# Patient Record
Sex: Female | Born: 1960 | Race: White | Hispanic: No | Marital: Married | Smoking: Current every day smoker
Health system: Southern US, Community
[De-identification: ages and names within clinical notes are randomized; demographics above are authoritative.]

## PROBLEM LIST (undated history)

## (undated) DIAGNOSIS — M5416 Radiculopathy, lumbar region: Secondary | ICD-10-CM

## (undated) DIAGNOSIS — R918 Other nonspecific abnormal finding of lung field: Secondary | ICD-10-CM

## (undated) DIAGNOSIS — M5430 Sciatica, unspecified side: Secondary | ICD-10-CM

## (undated) DIAGNOSIS — G8929 Other chronic pain: Secondary | ICD-10-CM

## (undated) DIAGNOSIS — M549 Dorsalgia, unspecified: Secondary | ICD-10-CM

## (undated) HISTORY — PX: PLEURAL SCARIFICATION: SHX748

---

## 2010-06-10 ENCOUNTER — Emergency Department (HOSPITAL_COMMUNITY)
Admission: EM | Admit: 2010-06-10 | Discharge: 2010-06-10 | Payer: Self-pay | Source: Home / Self Care | Admitting: Emergency Medicine

## 2012-01-13 ENCOUNTER — Emergency Department (HOSPITAL_COMMUNITY)

## 2012-01-13 ENCOUNTER — Encounter (HOSPITAL_COMMUNITY): Payer: Self-pay | Admitting: *Deleted

## 2012-01-13 ENCOUNTER — Emergency Department (HOSPITAL_COMMUNITY)
Admission: EM | Admit: 2012-01-13 | Discharge: 2012-01-13 | Disposition: A | Discharge status: Home / Self Care | Attending: Emergency Medicine | Admitting: Emergency Medicine

## 2012-01-13 DIAGNOSIS — M546 Pain in thoracic spine: Secondary | ICD-10-CM | POA: Insufficient documentation

## 2012-01-13 DIAGNOSIS — F17200 Nicotine dependence, unspecified, uncomplicated: Secondary | ICD-10-CM | POA: Insufficient documentation

## 2012-01-13 DIAGNOSIS — R0789 Other chest pain: Secondary | ICD-10-CM

## 2012-01-13 LAB — BASIC METABOLIC PANEL
BUN: 16 mg/dL (ref 6–23)
CO2: 27 mEq/L (ref 19–32)
Calcium: 9.4 mg/dL (ref 8.4–10.5)
GFR calc non Af Amer: 78 mL/min — ABNORMAL LOW (ref >90)
Glucose, Bld: 95 mg/dL (ref 70–99)
Sodium: 135 mEq/L (ref 135–145)

## 2012-01-13 LAB — CBC WITH DIFFERENTIAL/PLATELET
Eosinophils Absolute: 0.3 10*3/uL (ref 0.0–0.7)
Eosinophils Relative: 3 % (ref 0–5)
HCT: 41.4 % (ref 36.0–46.0)
Hemoglobin: 14.1 g/dL (ref 12.0–15.0)
Lymphocytes Relative: 21 % (ref 12–46)
Lymphs Abs: 1.8 10*3/uL (ref 0.7–4.0)
MCH: 31.9 pg (ref 26.0–34.0)
MCV: 93.7 fL (ref 78.0–100.0)
Monocytes Relative: 10 % (ref 3–12)
Platelets: 209 10*3/uL (ref 150–400)
RBC: 4.42 MIL/uL (ref 3.87–5.11)
WBC: 8.8 10*3/uL (ref 4.0–10.5)

## 2012-01-13 NOTE — ED Provider Notes (Signed)
History   This chart was scribed for Kristina Lyons, MD by Melba Coon. The patient was seen in room APA18/APA18 and the patient's care was started at 3:31PM.    CSN: [id number]  Arrival date & time 01/13/12  1447   First MD Initiated Contact with Patient 01/13/12 1529      Chief Complaint  Patient presents with  . Shoulder Pain    (Consider location/radiation/quality/duration/timing/severity/associated sxs/prior treatment) The history is provided by the patient. No language interpreter was used.   Kristina Edwards is a 51 y.o. female who presents to the Emergency Department complaining of constant, moderate to severe, sharp, left shoulder pain that radiates to the left axilla with tingling down the left arm with an onset 2 days ago. 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 SOB present from pain; deep inhalation aggravates the pain; pt tries to breathe from her mouth. Pt has HX of collapsed lung "years and years ago" that had no trauma; pt states that she had similar pain then. No HA, fever, neck pain, sore throat, rash, back pain, CP, SOB, abd pain, n/v/d, dysuria, or extremity edema, weakness, or numbness. No other pertinent medical symptoms. Pt is a current smoker (1 pack a day).   History reviewed. No pertinent past medical history.  History reviewed. No pertinent past surgical history.  History reviewed. No pertinent family history.  History  Substance Use Topics  . Smoking status: Current Everyday Smoker -- 1.0 packs/day    Types: Cigarettes  . Smokeless tobacco: Not on file  . Alcohol Use: Yes    OB History    Grav Para Term Preterm Abortions TAB SAB Ect Mult Living                  Review of Systems 10 Systems reviewed and all are negative for acute change except as noted in the HPI.   Allergies  Clindamycin/lincomycin; Codeine; and Penicillins  Home Medications  No current outpatient  prescriptions on file.  BP 151/80  Pulse 77  Temp 98 F (36.7 C) (Oral)  Resp 18  Ht 5\' 4"  (1.626 m)  Wt 112 lb (50.803 kg)  BMI 19.22 kg/m2  SpO2 100%  LMP 12/04/2011  Physical Exam  Nursing note and vitals reviewed. Constitutional: She is oriented to person, place, and time. She appears well-developed and well-nourished.  HENT:  Head: Normocephalic and atraumatic.  Eyes: EOM are normal. Pupils are equal, round, and reactive to light.  Neck: Normal range of motion. Neck supple.  Cardiovascular: Normal rate, normal heart sounds and intact distal pulses.   Pulmonary/Chest: Effort normal and breath sounds normal. She exhibits tenderness (left chest wall).  Abdominal: Bowel sounds are normal. She exhibits no distension. There is no tenderness.  Musculoskeletal: Normal range of motion. She exhibits tenderness (ttp soft tissue in left scapular region). She exhibits no edema.  Neurological: She is alert and oriented to person, place, and time. She has normal strength. No cranial nerve deficit or sensory deficit.       NV and strength intact in bilateral upper extremities.  Skin: Skin is warm and dry. No rash noted.  Psychiatric: She has a normal mood and affect.    ED Course  Procedures (including critical care time)  DIAGNOSTIC STUDIES: Oxygen Saturation is 100% on room air, normal by my interpretation.    COORDINATION OF CARE:  3:36PM - CXR, blood w/u,  and EKG will be ordered for the pt. 5:26PM - recheck; imaging and lab results reviewed; results are unremarkable. Pt does not want Rx pain meds. Pt is advised to take ibuprofen. Pt ready for d/c.  Labs Reviewed  BASIC METABOLIC PANEL - Abnormal; Notable for the following:    GFR calc non Af Amer 78 (*)     GFR calc Af Amer 90 (*)     All other components within normal limits  CBC WITH DIFFERENTIAL  D-DIMER, QUANTITATIVE   Dg Chest 2 View  01/13/2012  *RADIOLOGY REPORT*  Clinical Data: 2 days of left-sided chest pain.   CHEST - 2 VIEW  Comparison: None.  Findings: Midline trachea.  Normal heart size and mediastinal contours. No pleural effusion or pneumothorax.  Biapical pleural parenchymal scarring. Diffuse peribronchial thickening.  Clear lungs.  IMPRESSION: 1.  No acute cardiopulmonary disease. 2.  Mild peribronchial thickening which may relate to chronic bronchitis or smoking.  Original Report Authenticated By: Consuello Bossier, M.D.     No diagnosis found.   Date: 01/13/2012  Rate: 74  Rhythm: normal sinus rhythm  QRS Axis: normal  Intervals: normal  ST/T Wave abnormalities: normal  Conduction Disutrbances:none  Narrative Interpretation:   Old EKG Reviewed: unchanged    MDM  The patient presents complaining of pain in the left upper back, lateral chest area.  There is nothing in the workup to suggest a cardiac etiology and the D-Dimer is negative, making pe very unlikely.  She is also not hypoxic, tachycardic or tachypneic.  The chest xray does not reveal a ptx or pneumonia.  The symptoms are likely musculoskeletal in nature.  She will be discharged to home with nsaids, rest, time.     I personally performed the services described in this documentation, which was scribed in my presence. The recorded information has been reviewed and considered.           Kristina Lyons, MD 01/13/12 [phone]

## 2012-01-13 NOTE — ED Notes (Signed)
Pt c/o pain under her left shoulder down her arm x 2 days. Pt c/o worsening pain with deep breathing. Denies injury.

## 2013-03-14 ENCOUNTER — Encounter (HOSPITAL_COMMUNITY): Payer: Self-pay | Admitting: Emergency Medicine

## 2013-03-14 ENCOUNTER — Emergency Department (HOSPITAL_COMMUNITY): Payer: TRICARE For Life (TFL)

## 2013-03-14 ENCOUNTER — Observation Stay (HOSPITAL_COMMUNITY)
Admission: EM | Admit: 2013-03-14 | Discharge: 2013-03-15 | Disposition: A | Discharge status: Home / Self Care | Payer: TRICARE For Life (TFL) | Attending: Family Medicine | Admitting: Family Medicine

## 2013-03-14 DIAGNOSIS — R911 Solitary pulmonary nodule: Secondary | ICD-10-CM

## 2013-03-14 DIAGNOSIS — F17200 Nicotine dependence, unspecified, uncomplicated: Secondary | ICD-10-CM | POA: Insufficient documentation

## 2013-03-14 DIAGNOSIS — R918 Other nonspecific abnormal finding of lung field: Secondary | ICD-10-CM | POA: Diagnosis present

## 2013-03-14 DIAGNOSIS — N390 Urinary tract infection, site not specified: Secondary | ICD-10-CM | POA: Diagnosis present

## 2013-03-14 DIAGNOSIS — D72829 Elevated white blood cell count, unspecified: Secondary | ICD-10-CM | POA: Diagnosis present

## 2013-03-14 DIAGNOSIS — N3090 Cystitis, unspecified without hematuria: Principal | ICD-10-CM | POA: Insufficient documentation

## 2013-03-14 DIAGNOSIS — R109 Unspecified abdominal pain: Secondary | ICD-10-CM | POA: Diagnosis present

## 2013-03-14 HISTORY — DX: Other nonspecific abnormal finding of lung field: R91.8

## 2013-03-14 LAB — URINALYSIS, ROUTINE W REFLEX MICROSCOPIC
Glucose, UA: NEGATIVE mg/dL
Ketones, ur: NEGATIVE mg/dL
pH: 6 (ref 5.0–8.0)

## 2013-03-14 LAB — CBC WITH DIFFERENTIAL/PLATELET
Eosinophils Relative: 2 % (ref 0–5)
HCT: 41.1 % (ref 36.0–46.0)
Hemoglobin: 13.8 g/dL (ref 12.0–15.0)
Lymphocytes Relative: 13 % (ref 12–46)
Lymphs Abs: 1.6 10*3/uL (ref 0.7–4.0)
MCV: 93.2 fL (ref 78.0–100.0)
Monocytes Absolute: 1 10*3/uL (ref 0.1–1.0)
Monocytes Relative: 9 % (ref 3–12)
Neutro Abs: 9.2 10*3/uL — ABNORMAL HIGH (ref 1.7–7.7)
WBC: 12 10*3/uL — ABNORMAL HIGH (ref 4.0–10.5)

## 2013-03-14 LAB — POCT PREGNANCY, URINE: Preg Test, Ur: NEGATIVE

## 2013-03-14 LAB — PROTIME-INR
INR: 1.04 (ref 0.00–1.49)
Prothrombin Time: 13.4 seconds (ref 11.6–15.2)

## 2013-03-14 LAB — BASIC METABOLIC PANEL
CO2: 27 mEq/L (ref 19–32)
Chloride: 100 mEq/L (ref 96–112)
GFR calc Af Amer: >90 mL/min (ref >90)
Potassium: 3.8 mEq/L (ref 3.5–5.1)

## 2013-03-14 LAB — AMYLASE: Amylase: 66 U/L (ref 0–105)

## 2013-03-14 LAB — URINE MICROSCOPIC-ADD ON

## 2013-03-14 LAB — LIPASE, BLOOD: Lipase: 27 U/L (ref 11–59)

## 2013-03-14 MED ORDER — IOHEXOL 300 MG/ML  SOLN
100.0000 mL | Freq: Once | INTRAMUSCULAR | Status: AC | PRN
  Administered 2013-03-14: 100 mL via INTRAVENOUS

## 2013-03-14 MED ORDER — CIPROFLOXACIN IN D5W 400 MG/200ML IV SOLN
400.0000 mg | Freq: Two times a day (BID) | INTRAVENOUS | Status: DC
  Administered 2013-03-15: 400 mg via INTRAVENOUS
  Filled 2013-03-14 (×5): qty 200

## 2013-03-14 MED ORDER — CIPROFLOXACIN IN D5W 200 MG/100ML IV SOLN
200.0000 mg | Freq: Two times a day (BID) | INTRAVENOUS | Status: DC
  Filled 2013-03-14 (×6): qty 100

## 2013-03-14 MED ORDER — HYDROMORPHONE HCL PF 1 MG/ML IJ SOLN: 0.5000 mg | INTRAMUSCULAR | Status: DC | PRN

## 2013-03-14 MED ORDER — ONDANSETRON HCL 4 MG/2ML IJ SOLN
4.0000 mg | Freq: Once | INTRAMUSCULAR | Status: AC
  Administered 2013-03-14: 4 mg via INTRAVENOUS
  Filled 2013-03-14: qty 2

## 2013-03-14 MED ORDER — ACETAMINOPHEN 325 MG PO TABS
650.0000 mg | ORAL_TABLET | Freq: Four times a day (QID) | ORAL | Status: DC | PRN
  Administered 2013-03-14: 650 mg via ORAL
  Filled 2013-03-14: qty 2

## 2013-03-14 MED ORDER — ONDANSETRON HCL 4 MG/2ML IJ SOLN: 4.0000 mg | Freq: Four times a day (QID) | INTRAMUSCULAR | Status: DC | PRN

## 2013-03-14 MED ORDER — IOHEXOL 300 MG/ML  SOLN
50.0000 mL | Freq: Once | INTRAMUSCULAR | Status: AC | PRN
  Administered 2013-03-14: 50 mL via ORAL

## 2013-03-14 MED ORDER — SODIUM CHLORIDE 0.9 % IV BOLUS (SEPSIS)
500.0000 mL | Freq: Once | INTRAVENOUS | Status: AC
  Administered 2013-03-14: 500 mL via INTRAVENOUS

## 2013-03-14 MED ORDER — ACETAMINOPHEN 650 MG RE SUPP: 650.0000 mg | Freq: Four times a day (QID) | RECTAL | Status: DC | PRN

## 2013-03-14 MED ORDER — ONDANSETRON HCL 4 MG PO TABS: 4.0000 mg | ORAL_TABLET | Freq: Four times a day (QID) | ORAL | Status: DC | PRN

## 2013-03-14 MED ORDER — SODIUM CHLORIDE 0.9 % IV SOLN
INTRAVENOUS | Status: DC
  Administered 2013-03-15: via INTRAVENOUS

## 2013-03-14 MED ORDER — ENOXAPARIN SODIUM 40 MG/0.4ML ~~LOC~~ SOLN: 40.0000 mg | SUBCUTANEOUS | Status: DC

## 2013-03-14 MED ORDER — HYDROMORPHONE HCL PF 1 MG/ML IJ SOLN
1.0000 mg | Freq: Once | INTRAMUSCULAR | Status: AC
  Administered 2013-03-14: 1 mg via INTRAVENOUS
  Filled 2013-03-14: qty 1

## 2013-03-14 MED ORDER — CIPROFLOXACIN IN D5W 400 MG/200ML IV SOLN
400.0000 mg | Freq: Once | INTRAVENOUS | Status: AC
  Administered 2013-03-14: 400 mg via INTRAVENOUS
  Filled 2013-03-14: qty 200

## 2013-03-14 NOTE — H&P (Signed)
Patient seen, independently examined and chart reviewed. I agree with exam, assessment and plan discussed with Toya Smothers, NP.  52 year old woman who presented to the emergency department with sudden onset of severe lower abdominal pain. Symptoms began on 10/16 with dysuria, she took over the counter medication without significant improvement but had little pain. Today 10/17 in the morning she developed severe lower abdominal pain, suprapubic, left lower quadrant, right lower quadrant. She has some soreness in her lower back bilaterally paravertebrally as well. She has had no fever, nausea or vomiting. She has no history of nephrolithiasis. She has had no abdominal surgeries. She has had a few bladder infections in the past but never with severe pain.  In the emergency department she was noted to be afebrile with stable vital signs. White blood cell count 12, CBC otherwise unremarkable and basic metabolic panel is normal. Urine pregnancy was negative. Urinalysis was notable for too numerous to count red blood cells, 7-10 white blood cells, large leukocyte esterase and nitrate positive. Because of exquisite lower abdominal pain suprapubic pain EDP performed a pelvic exam which was unremarkable. CT of the abdomen and pelvis with contrast was unremarkable although appendix could not be visualized.  Currently she feels somewhat better but still has lower abdominal pain. Heart and lung exam is unremarkable. She appears nontoxic. No upper abdominal pain. She has some bilateral CVA tenderness left greater than right. She has exquisite suprapubic pain, moderate to severe right lower quadrant and left lower quadrant pain. Positive rebound. No guarding.  A/P  Marked suprapubic and lower abdominal pain without fever, nausea or vomiting  Suspected hemorrhagic cystitis, possible developing pyelonephritis  Nonvisualization of the appendix on CT abdomen and pelvis  Incidental finding of lung nodules  Cigarette  smoker  Discussed with Dr. Lovell Sheehan, he will see in consultation, but for now allow a diet. Given history and objective findings, UTI the most likely explanation for pain. For now we will treat UTI empirically, followup urine culture,pain control, serial abdominal exam. If condition fails to improve or patient worsens then diagnostic laparoscopic would be considered. Differential would include appendicitis. Adnexa appeared normal on CT and patient without fever, n/v or bleeding. Pelvic exam unremarkable per EDP.  Above discussed with patient and husband at bedside. I reviewed all the laboratory and imaging findings with them.  Brendia Sacks, MD [facility] [phone]

## 2013-03-14 NOTE — ED Provider Notes (Signed)
CSN: [id number]     Arrival date & time 03/14/13  1231 History   First MD Initiated Contact with Patient 03/14/13 1259     Chief Complaint  Patient presents with  . Dysuria    Patient is a 52 y.o. female presenting with abdominal pain. The history is provided by the patient and a significant other.  Abdominal Pain Pain location:  Suprapubic Pain quality: aching   Pain severity:  Moderate Onset quality:  Gradual Duration:  1 day Timing:  Constant Progression:  Worsening Relieved by:  Nothing Worsened by:  Palpation Associated symptoms: dysuria   Associated symptoms: no chest pain, no fever, no vaginal bleeding and no vaginal discharge     PMH - none  Past Surgical History  Procedure Laterality Date  . Cesarean section    . Pleural scarification     History reviewed. No pertinent family history. History  Substance Use Topics  . Smoking status: Current Every Day Smoker -- 1.00 packs/day    Types: Cigarettes  . Smokeless tobacco: Not on file  . Alcohol Use: Yes   OB History   Grav Para Term Preterm Abortions TAB SAB Ect Mult Living                 Review of Systems  Constitutional: Negative for fever.  Cardiovascular: Negative for chest pain.  Gastrointestinal: Positive for abdominal pain.  Genitourinary: Positive for dysuria and frequency. Negative for vaginal bleeding and vaginal discharge.  Musculoskeletal: Positive for back pain.  All other systems reviewed and are negative.    Allergies  Clindamycin/lincomycin; Codeine; and Penicillins  Home Medications   Current Outpatient Rx  Name  Route  Sig  Dispense  Refill  . Phenazopyridine HCl (AZO DINE MAXIMUM STRENGTH) 97.5 MG TABS   Oral   Take 2 tablets by mouth 3 (three) times daily after meals.          BP 147/84  Pulse 84  Temp(Src) 97.7 F (36.5 C) (Oral)  Resp 20  Ht 5\' 4"  (1.626 m)  Wt 110 lb (49.896 kg)  BMI 18.87 kg/m2  SpO2 100%  LMP 02/12/2013 Physical Exam CONSTITUTIONAL: Well  developed/well nourished, anxious HEAD: Normocephalic/atraumatic EYES: EOMI/PERRL ENMT: Mucous membranes moist NECK: supple no meningeal signs SPINE:entire spine nontender CV: S1/S2 noted, no murmurs/rubs/gallops noted LUNGS: Lungs are clear to auscultation bilaterally, no apparent distress ABDOMEN: soft, diffusely tender, mostly tender in RLQ/LLQ and suprapubic region.  There is moderate tenderness present GU:no cva tenderness, no CMT, no vag bleeding noted.  No adnexal mass noted.  Female chaperone present NEURO: Pt is awake/alert, moves all extremitiesx4 EXTREMITIES: pulses normal, full ROM SKIN: warm, color normal PSYCH: anxious  ED Course  Procedures Labs Review Labs Reviewed  URINALYSIS, ROUTINE W REFLEX MICROSCOPIC - Abnormal; Notable for the following:    Hgb urine dipstick LARGE (*)    Protein, ur 30 (*)    Nitrite POSITIVE (*)    Leukocytes, UA LARGE (*)    All other components within normal limits  URINE MICROSCOPIC-ADD ON - Abnormal; Notable for the following:    Bacteria, UA MANY (*)    All other components within normal limits  URINE CULTURE  BASIC METABOLIC PANEL  CBC WITH DIFFERENTIAL  POCT PREGNANCY, URINE   Imaging Review No results found.  EKG Interpretation   None      1:48 PM Pt presenting with what she thought was UTI.  However, she has significant lower abdominal tenderness and pain out of proportion to  exam.  Will obtain CT imaging Pt agreeable with plan 3:18 PM I discussed with patient about CT findings - she is aware that she needs repeat CT scan of chest in 6-12 months for lung nodules (pt is a smoker) Appendix not visualized.  She is still diffusely tender, with RLQ tenderness noted. She does not have any rigidity and her abdomen is soft to palpation D/w dr Lovell Sheehan.  Given continued pain and UTI, recommends hospitalist admission and surgical consult as would not be candidate for surgical exploration at this time 3:37 PM D/w dr Irene Limbo,  will admit to medicine for observation   MDM  No diagnosis found. Nursing notes including past medical history and social history reviewed and considered in documentation Labs/vital reviewed and considered     Joya Gaskins, MD 03/14/13 1538

## 2013-03-14 NOTE — H&P (Signed)
[facility] History and Physical  Kristina Edwards:[id number] DOB: 04-21-1961 DOA: 03/14/2013  Referring physician:  PCP: Kirstie Peri, MD  Specialists:   Chief Complaint: Dysuria and lower abdominal pain  HPI: Kristina Edwards is a 52 y.o. female with basically no medical history who presents to the emergency department with chief complaint of lower abdominal pain. Information obtained from the patient and her husband is at the bedside. She reports that yesterday morning she developed dysuria with urination particularly at the initiation of flow. She states that she got some over-the-counter medication but the dysuria did not subside. She also reports frequency. She denies hematuria Then this morning when she awakened she developed sudden right lower quadrant pain. She denies any nausea or vomiting. She denies any constipation or diarrhea. She does indicate that she had a normal bowel movement yesterday. She denies any fever chills chest pain palpitations headache dizziness syncope or near-syncope. She denies any vaginal discharge She indicates that she went to work and the pain became so intense that she could barely stand up. She came to the emergency room. Initial evaluation in the emergency room yields urinary tract infection via urinalysis, lab work significant for a white count of 12.0 otherwise unremarkable, CT of the abdomen no acute findings in the abdomen or pelvis but the appendix was not visualized. On exam abdominal tenderness and pain is out of proportion. Vital signs were unremarkable. We are asked to admit for further evaluation    Review of Systems: 10 point review of systems completed all systems are negative except as indicated in the history of present illness History reviewed. No pertinent past medical history. Past Surgical History  Procedure Laterality Date  . Cesarean section    . Pleural scarification     Social History:  reports that she has been smoking  Cigarettes.  She has been smoking about 1.00 pack per day. She does not have any smokeless tobacco history on file. She reports that she drinks alcohol. She reports that she does not use illicit drugs. Patient lives at home with her husband. 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 Clindamycin/Lincomycin Other (See Comments)    Chest pain  . Codeine Nausea And Vomiting  . Penicillins Rash    History reviewed. No pertinent family history. mother's medical history includes hypertension hyperlipidemia. Father's medical history includes hypertension  Prior to Admission medications   Medication Sig Start Date End Date Taking? Authorizing Provider  Phenazopyridine HCl (AZO DINE MAXIMUM STRENGTH) 97.5 MG TABS Take 2 tablets by mouth 3 (three) times daily after meals.   Yes Historical Provider, MD   Physical Exam: Filed Vitals:   03/14/13 1507  BP: 124/74  Pulse: 81  Temp:   Resp: 18     General:  Well-nourished somewhat uncomfortable appearing no acute distress  Eyes: PERRLA, EOMI, no scleral icterus  ENT: Ears clear nose without drainage oropharynx without erythema or exudate. Mucous membranes of the mouth are somewhat dry but pink  Neck: Supple no JVD full range of motion no lymphadenopathy  Cardiovascular: Regular rate and rhythm no murmur no gallop no rub no lower extremity edema  Respiratory: Normal effort breath sounds slightly coarse but clear bilaterally I hear no wheeze no rhonchi  Abdomen: Soft diffusely tender to even the placement of the stethoscope when auscultating bowel sounds. Tenderness and pain focused on lower quadrants and suprapubic region. No guarding questionable rebound in right lower. Costovertebral angle with  mild tenderness  Skin: Warm and dry no rashes  Musculoskeletal: No clubbing no cyanosis good muscle tone  Psychiatric: Calm cooperative somewhat teary  Neurologic: Cranial nerves II through XII  grossly intact speech clear facial symmetry  Labs on Admission:  Basic Metabolic Panel:  Recent Labs Lab 03/14/13 1322  NA 136  K 3.8  CL 100  CO2 27  GLUCOSE 94  BUN 12  CREATININE 0.86  CALCIUM 9.6   Liver Function Tests: No results found for this basename: AST, ALT, ALKPHOS, BILITOT, PROT, ALBUMIN,  in the last 168 hours No results found for this basename: LIPASE, AMYLASE,  in the last 168 hours No results found for this basename: AMMONIA,  in the last 168 hours CBC:  Recent Labs Lab 03/14/13 1322  WBC 12.0*  NEUTROABS 9.2*  HGB 13.8  HCT 41.1  MCV 93.2  PLT 220   Cardiac Enzymes: No results found for this basename: CKTOTAL, CKMB, CKMBINDEX, TROPONINI,  in the last 168 hours  BNP (last 3 results) No results found for this basename: PROBNP,  in the last 8760 hours CBG: No results found for this basename: GLUCAP,  in the last 168 hours  Radiological Exams on Admission: Ct Abdomen Pelvis W Contrast  03/14/2013   CLINICAL DATA:  Pelvic pain off and on.  EXAM: CT ABDOMEN AND PELVIS WITH CONTRAST  TECHNIQUE: Multidetector CT imaging of the abdomen and pelvis was performed using the standard protocol following bolus administration of intravenous contrast.  CONTRAST:  OMNIPAQUE IOHEXOL 300 MG/ML  SOLN  COMPARISON:  None.  FINDINGS: Minimal dependent subsegmental atelectasis in the lung bases. 2 small adjacent pleural based nodules in the left lower lobe on image 3 measuring approximately 6 mm. No pleural effusions. Heart is normal size.  Liver, gallbladder, spleen, pancreas, adrenals and kidneys are unremarkable. Stomach, large and small bowel are unremarkable. Appendix is not visualized, but no secondary signs of appendicitis. Uterus, adnexae and urinary bladder are unremarkable. No free fluid, free air or adenopathy. Aorta is normal caliber.  No acute bony abnormality.  IMPRESSION: No acute findings in the abdomen or pelvis.  2 pleural-based nodules in the left lower  lobe of approximately 6 mm. If the patient is at high risk for bronchogenic carcinoma, follow-up chest CT at 6-12 months is recommended. If the patient is at low risk for bronchogenic carcinoma, follow-up chest CT at 12 months is recommended. This recommendation follows the consensus statement: Guidelines for Management of Small Pulmonary Nodules Detected on CT Scans: A Statement from the Fleischner Society as published in Radiology 2005;237:395-400.   Electronically Signed   By: Charlett Nose M.D.   On: 03/14/2013 14:41    EKG:   Assessment/Plan Principal Problem:   Abdominal pain: Etiology uncertain. Presumably related to the urinary tract infection however pain and tenderness on exam is out of proportion to infection and CT findings. Some concern that appendix was not visualized. Will request general surgery consult. Will check lipase and amylase for completeness. Pregnancy test negative.  Will admit to medical floor for pain management, supportive therapy and further evaluation. Will keep n.p.o. until evaluated by general surgeon. Active Problems:   UTI (lower urinary tract infection): Await urine cultures. Continue Cipro.   Leukocytosis: Mild. Likely related to above. Patient is afebrile and nontoxic. Will continue Cipro IV.  Lung nodules. Incidental finding on abdominal CT. Left lower lobe lung nodules. Patient is a smoker so there is concern for malignancy. Recommend repeat CT in 6 months.  Code Status: full Family Communication:  Disposition Plan: home when ready  Time spent: 60 minutes  Gwenyth Bender [facility] Pager [phone]  If 7PM-7AM, please contact night-coverage [email or web address] Password [email or web address] 03/14/2013, 4:10 PM

## 2013-03-14 NOTE — ED Notes (Signed)
Dysuria and low abd pain , no NVD  Frequency of urination, NO fever or chills.

## 2013-03-14 NOTE — ED Notes (Signed)
Attempted report. Receiving RN reported would call back.  

## 2013-03-15 DIAGNOSIS — R918 Other nonspecific abnormal finding of lung field: Secondary | ICD-10-CM

## 2013-03-15 LAB — CBC
MCH: 31 pg (ref 26.0–34.0)
MCHC: 33.2 g/dL (ref 30.0–36.0)
MCV: 93.4 fL (ref 78.0–100.0)
Platelets: 197 10*3/uL (ref 150–400)
RBC: 3.94 MIL/uL (ref 3.87–5.11)
RDW: 12.6 % (ref 11.5–15.5)
WBC: 7.4 10*3/uL (ref 4.0–10.5)

## 2013-03-15 LAB — BASIC METABOLIC PANEL
CO2: 27 mEq/L (ref 19–32)
Calcium: 8.9 mg/dL (ref 8.4–10.5)
Creatinine, Ser: 0.78 mg/dL (ref 0.50–1.10)
GFR calc Af Amer: >90 mL/min (ref >90)
GFR calc non Af Amer: >90 mL/min (ref >90)
Sodium: 137 mEq/L (ref 135–145)

## 2013-03-15 MED ORDER — CIPROFLOXACIN HCL 500 MG PO TABS: 500.0000 mg | ORAL_TABLET | Freq: Two times a day (BID) | ORAL | Status: DC

## 2013-03-15 MED ORDER — CIPROFLOXACIN HCL 250 MG PO TABS: 500.0000 mg | ORAL_TABLET | Freq: Two times a day (BID) | ORAL | Status: DC

## 2013-03-15 NOTE — Discharge Summary (Signed)
Physician Discharge Summary  Kristina Edwards WUJ:[id number] DOB: 03-Aug-1960 DOA: 03/14/2013  PCP: Kristina Peri, MD  Admit date: 03/14/2013 Discharge date: 03/15/2013  Recommendations for Outpatient Follow-up:  1. Hemorrhagic cystitis--urine culture pending. 2. Continue to encourage smoking cessation 3. Incidental finding of lung nodules. Recommend repeat CAT scan in 6 months. See CT imaging report below.   Discharge Diagnoses:  1. Hemorrhagic cystitis, possible pyelonephritis with associated suprapubic and lower abdominal pain 2. Cigarette smoker 3. Incidental finding of lung nodules  Discharge Condition: Improved Disposition: Home  Diet recommendation: Regular  Filed Weights   03/14/13 1236 03/14/13 1700  Weight: 49.896 kg (110 lb) 52.708 kg (116 lb 3.2 oz)    History of present illness:  52 year old woman who presented to the emergency department with sudden onset of severe lower abdominal pain. Symptoms began on 10/16 with dysuria, she took over the counter medication without significant improvement but had little pain. Today 10/17 in the morning she developed severe lower abdominal pain, suprapubic, left lower quadrant, right lower quadrant.    Hospital Course:  Kristina Edwards was admitted for further evaluation of marked lower abdominal and suprapubic pain and treatment for hemorrhagic cystitis. Because of her significant lower abdominal pain and nonvisualization of the appendix general surgery consultation was obtained. She was seen in consultation with general surgery but given her marked clinical improvement with IV antibiotics and treatment for cystitis, no further evaluation was recommended. Her hospitalization was uncomplicated and she is stable for discharge. See individual issues as below.  1. Hemorrhagic cystitis, possible pyelonephritis: Much improved today. No fever, nausea or vomiting. Marked suprapubic and lower abdominal pain resolving rapidly. No evidence to suggest  acute intra-abdominal process. Discussed with Dr. Granville Lewis will plan discharge home. 2. Cigarette smoker: Recommend cessation. 3. Incidental finding of lung nodules: Followup as an outpatient  Consultants:  General surgery Procedures:  None Antibiotics:  Ciprofloxacin 10/18 >> 10/24  Discharge Instructions  Discharge Orders   Future Orders Complete By Expires   Activity as tolerated - No restrictions  As directed    Diet general  As directed    Discharge instructions  As directed    Comments:     Be sure to finish antibiotics. Call your physician or seek immediate medical attention for fever, worsening pain or worsening of condition. Please stop smoking. The CAT scan of your abdomen and pelvis showed 2 small lung nodules. This information will be sent to primary care doctor. For now all that is recommended as followup in 6-12 months.       Medication List    STOP taking these medications       AZO DINE MAXIMUM STRENGTH 97.5 MG Tabs  Generic drug:  Phenazopyridine HCl      TAKE these medications       ciprofloxacin 500 MG tablet  Commonly known as:  CIPRO  Take 1 tablet (500 mg total) by mouth 2 (two) times daily. First dose this evening at 6pm.       Allergies  Allergen Reactions  . Clindamycin/Lincomycin Other (See Comments)    Chest pain  . Codeine Nausea And Vomiting  . Penicillins Rash    The results of significant diagnostics from this hospitalization (including imaging, microbiology, ancillary and laboratory) are listed below for reference.    Significant Diagnostic Studies: Ct Abdomen Pelvis W Contrast  03/14/2013   CLINICAL DATA:  Pelvic pain off and on.  EXAM: CT ABDOMEN AND PELVIS WITH CONTRAST  TECHNIQUE: Multidetector CT imaging of the abdomen  and pelvis was performed using the standard protocol following bolus administration of intravenous contrast.  CONTRAST:  OMNIPAQUE IOHEXOL 300 MG/ML  SOLN  COMPARISON:  None.  FINDINGS: Minimal dependent  subsegmental atelectasis in the lung bases. 2 small adjacent pleural based nodules in the left lower lobe on image 3 measuring approximately 6 mm. No pleural effusions. Heart is normal size.  Liver, gallbladder, spleen, pancreas, adrenals and kidneys are unremarkable. Stomach, large and small bowel are unremarkable. Appendix is not visualized, but no secondary signs of appendicitis. Uterus, adnexae and urinary bladder are unremarkable. No free fluid, free air or adenopathy. Aorta is normal caliber.  No acute bony abnormality.  IMPRESSION: No acute findings in the abdomen or pelvis.  2 pleural-based nodules in the left lower lobe of approximately 6 mm. If the patient is at high risk for bronchogenic carcinoma, follow-up chest CT at 6-12 months is recommended. If the patient is at low risk for bronchogenic carcinoma, follow-up chest CT at 12 months is recommended. This recommendation follows the consensus statement: Guidelines for Management of Small Pulmonary Nodules Detected on CT Scans: A Statement from the Fleischner Society as published in Radiology 2005;237:395-400.   Electronically Signed   By: Charlett Nose M.D.   On: 03/14/2013 14:41   Labs: Basic Metabolic Panel:  Recent Labs Lab 03/14/13 1322 03/15/13 0648  NA 136 137  K 3.8 3.6  CL 100 102  CO2 27 27  GLUCOSE 94 99  BUN 12 11  CREATININE 0.86 0.78  CALCIUM 9.6 8.9    CBC:  Recent Labs Lab 03/14/13 1322 03/15/13 0648  WBC 12.0* 7.4  NEUTROABS 9.2*  --   HGB 13.8 12.2  HCT 41.1 36.8  MCV 93.2 93.4  PLT 220 197    Principal Problem:   Abdominal pain Active Problems:   UTI (lower urinary tract infection)   Leukocytosis   Lung nodules   Time coordinating discharge: 20 minutes  Signed:  Brendia Sacks, MD [facility] 03/15/2013, 10:19 AM

## 2013-03-15 NOTE — Plan of Care (Signed)
Problem: Discharge Progression Outcomes Goal: Other Discharge Outcomes/Goals Outcome: Completed/Met Date Met:  03/15/13 Pt denies pain tolerates diet and is ambulatory

## 2013-03-15 NOTE — Progress Notes (Signed)
[facility] PROGRESS NOTE  Kristina Edwards WJX:[id number] DOB: 1960/08/05 DOA: 03/14/2013 PCP: Kirstie Peri, MD  Assessment/Plan: 1. Hemorrhagic cystitis, possible pyelonephritis: Much improved today. No fever, nausea or vomiting. Marked suprapubic and lower abdominal pain resolving rapidly. No evidence to suggest acute intra-abdominal process. Discussed with Dr. Granville Lewis will plan discharge home. 2. Cigarette smoker: Recommend cessation. 3. Incidental finding of lung nodules: Followup as an outpatient   Change to oral antibiotics  Home today  Pending studies:   Urine culture  Brendia Sacks, MD  [facility]  Pager [phone] If 7PM-7AM, please contact night-coverage at [email or web address], password [facility] 03/15/2013, 10:07 AM  LOS: 1 day   Summary: 52 year old woman who presented to the emergency department with sudden onset of severe lower abdominal pain. Symptoms began on 10/16 with dysuria, she took over the counter medication without significant improvement but had little pain. Today 10/17 in the morning she developed severe lower abdominal pain, suprapubic, left lower quadrant, right lower quadrant.   Consultants:  General surgery  Procedures:  None  Antibiotics:  Ciprofloxacin 10/18 >> 10/24  HPI/Subjective: Feels much better. Feels ready to go home. Minimal pain. Tolerating diet.  Objective: Filed Vitals:   03/14/13 1700 03/14/13 1811 03/14/13 2050 03/15/13 0642  BP:  117/68 103/56 114/74  Pulse:  82 73 79  Temp:  97.9 F (36.6 C) 98.3 F (36.8 C) 98.7 F (37.1 C)  TempSrc:  Oral Oral Oral  Resp:  20 20 20   Height: 5\' 4"  (1.626 m)     Weight: 52.708 kg (116 lb 3.2 oz)     SpO2:  96% 97% 96%    Intake/Output Summary (Last 24 hours) at 03/15/13 1007 Last data filed at 03/14/13 1900  Gross per 24 hour  Intake    360 ml  Output      0 ml  Net    360 ml     Filed Weights   03/14/13 1236 03/14/13 1700  Weight: 49.896 kg (110 lb) 52.708 kg  (116 lb 3.2 oz)    Exam:   Afebrile, vital signs stable  General: Appears calm and comfortable. Appears well.  Psychiatric: Grossly normal mood and affect. Speech fluent and appropriate.  Cardiovascular: Regular rate and rhythm. No murmur, rub, gallop.  Respiratory: Clear to auscultation bilaterally. No wheezes, rales, rhonchi. Normal respiratory effort.  Abdomen: Soft, nondistended, minimal suprapubic tenderness.  Data Reviewed:  Leukocytosis has resolved, CBC normal.  Basic metabolic panel within normal limits.  Scheduled Meds: . ciprofloxacin  400 mg Intravenous Q12H   Continuous Infusions: . sodium chloride 75 mL/hr at 03/15/13 0004    Principal Problem:   Abdominal pain Active Problems:   UTI (lower urinary tract infection)   Leukocytosis   Lung nodules

## 2013-03-15 NOTE — Consult Note (Signed)
Reason for Consult: Abdominal pain, question appendicitis Referring Physician: [facility]  Kristina Edwards is an 52 y.o. female.  HPI: Patient is a 52 year old white female who presents to the emergency room with a 24-hour history of worsening lower abdominal pain. She states that the day before, she had an episode where she started having a burning while urinating. This pain seemed to progress over the next 24 hours and she presented to the emergency room with lower abdominal pain. CT scan the abdomen was performed which did not see the appendix. Her pain was across the lower abdomen, but seemed to be more on the right side. Pelvic exam was negative. Urinalysis was remarkable for a urinary tract infection, possible cystitis. Due to the concern for possible appendicitis and the need to control her pain, she was admitted to the hospital for further evaluation treatment. She states she does not have a history of frequent urinary tract infections. Today, she states her pain has much improved and is just localized to the suprapubic region.  Past Medical History  Diagnosis Date  . Lung nodules     LLL incidental finding CT 10/14. repeat 6 months    Past Surgical History  Procedure Laterality Date  . Cesarean section    . Pleural scarification      History reviewed. No pertinent family history.  Social History:  reports that she has been smoking Cigarettes.  She has been smoking about 1.00 pack per day. She does not have any smokeless tobacco history on file. She reports that she drinks alcohol. She reports that she does not use illicit drugs.  Allergies:  Allergies  Allergen Reactions  . Clindamycin/Lincomycin Other (See Comments)    Chest pain  . Codeine Nausea And Vomiting  . Penicillins Rash    Medications: I have reviewed the patient's current medications.  Results for orders placed during the hospital encounter of 03/14/13 (from the past 48 hour(s))  URINALYSIS, ROUTINE W  REFLEX MICROSCOPIC     Status: Abnormal   Collection Time    03/14/13 12:53 PM      Result Value Range   Color, Urine YELLOW  YELLOW   APPearance CLEAR  CLEAR   Specific Gravity, Urine 1.020  1.005 - 1.030   pH 6.0  5.0 - 8.0   Glucose, UA NEGATIVE  NEGATIVE mg/dL   Hgb urine dipstick LARGE (*) NEGATIVE   Bilirubin Urine NEGATIVE  NEGATIVE   Ketones, ur NEGATIVE  NEGATIVE mg/dL   Protein, ur 30 (*) NEGATIVE mg/dL   Urobilinogen, UA 0.2  0.0 - 1.0 mg/dL   Nitrite POSITIVE (*) NEGATIVE   Leukocytes, UA LARGE (*) NEGATIVE  URINE MICROSCOPIC-ADD ON     Status: Abnormal   Collection Time    03/14/13 12:53 PM      Result Value Range   Squamous Epithelial / LPF RARE  RARE   WBC, UA 7-10  <3 WBC/hpf   RBC / HPF TOO NUMEROUS TO COUNT  <3 RBC/hpf   Bacteria, UA MANY (*) RARE  POCT PREGNANCY, URINE     Status: None   Collection Time    03/14/13 12:56 PM      Result Value Range   Preg Test, Ur NEGATIVE  NEGATIVE   Comment:            THE SENSITIVITY OF THIS     METHODOLOGY IS >24 mIU/mL  BASIC METABOLIC PANEL     Status: Abnormal   Collection Time    03/14/13  1:22 PM      Result Value Range   Sodium 136  135 - 145 mEq/L   Potassium 3.8  3.5 - 5.1 mEq/L   Chloride 100  96 - 112 mEq/L   CO2 27  19 - 32 mEq/L   Glucose, Bld 94  70 - 99 mg/dL   BUN 12  6 - 23 mg/dL   Creatinine, Ser 0.86  0.50 - 1.10 mg/dL   Calcium 9.6  8.4 - 57.8 mg/dL   GFR calc non Af Amer 78 (*) >90 mL/min   GFR calc Af Amer >90  >90 mL/min   Comment: (NOTE)     The eGFR has been calculated using the CKD EPI equation.     This calculation has not been validated in all clinical situations.     eGFR's persistently <90 mL/min signify possible Chronic Kidney     Disease.  CBC WITH DIFFERENTIAL     Status: Abnormal   Collection Time    03/14/13  1:22 PM      Result Value Range   WBC 12.0 (*) 4.0 - 10.5 K/uL   RBC 4.41  3.87 - 5.11 MIL/uL   Hemoglobin 13.8  12.0 - 15.0 g/dL   HCT 46.9  62.9 - 52.8 %   MCV  93.2  78.0 - 100.0 fL   MCH 31.3  26.0 - 34.0 pg   MCHC 33.6  30.0 - 36.0 g/dL   RDW 41.3  24.4 - 01.0 %   Platelets 220  150 - 400 K/uL   Neutrophils Relative % 77  43 - 77 %   Neutro Abs 9.2 (*) 1.7 - 7.7 K/uL   Lymphocytes Relative 13  12 - 46 %   Lymphs Abs 1.6  0.7 - 4.0 K/uL   Monocytes Relative 9  3 - 12 %   Monocytes Absolute 1.0  0.1 - 1.0 K/uL   Eosinophils Relative 2  0 - 5 %   Eosinophils Absolute 0.2  0.0 - 0.7 K/uL   Basophils Relative 0  0 - 1 %   Basophils Absolute 0.0  0.0 - 0.1 K/uL  LIPASE, BLOOD     Status: None   Collection Time    03/14/13  1:22 PM      Result Value Range   Lipase 27  11 - 59 U/L  AMYLASE     Status: None   Collection Time    03/14/13  1:22 PM      Result Value Range   Amylase 66  0 - 105 U/L  PROTIME-INR     Status: None   Collection Time    03/14/13  1:22 PM      Result Value Range   Prothrombin Time 13.4  11.6 - 15.2 seconds   INR 1.04  0.00 - 1.49  BASIC METABOLIC PANEL     Status: None   Collection Time    03/15/13  6:48 AM      Result Value Range   Sodium 137  135 - 145 mEq/L   Potassium 3.6  3.5 - 5.1 mEq/L   Chloride 102  96 - 112 mEq/L   CO2 27  19 - 32 mEq/L   Glucose, Bld 99  70 - 99 mg/dL   BUN 11  6 - 23 mg/dL   Creatinine, Ser 2.72  0.50 - 1.10 mg/dL   Calcium 8.9  8.4 - 53.6 mg/dL   GFR calc non Af Amer >90  >90 mL/min  GFR calc Af Amer >90  >90 mL/min   Comment: (NOTE)     The eGFR has been calculated using the CKD EPI equation.     This calculation has not been validated in all clinical situations.     eGFR's persistently <90 mL/min signify possible Chronic Kidney     Disease.  CBC     Status: None   Collection Time    03/15/13  6:48 AM      Result Value Range   WBC 7.4  4.0 - 10.5 K/uL   RBC 3.94  3.87 - 5.11 MIL/uL   Hemoglobin 12.2  12.0 - 15.0 g/dL   HCT 40.9  81.1 - 91.4 %   MCV 93.4  78.0 - 100.0 fL   MCH 31.0  26.0 - 34.0 pg   MCHC 33.2  30.0 - 36.0 g/dL   RDW 78.2  95.6 - 21.3 %   Platelets  197  150 - 400 K/uL    Ct Abdomen Pelvis W Contrast  03/14/2013   CLINICAL DATA:  Pelvic pain off and on.  EXAM: CT ABDOMEN AND PELVIS WITH CONTRAST  TECHNIQUE: Multidetector CT imaging of the abdomen and pelvis was performed using the standard protocol following bolus administration of intravenous contrast.  CONTRAST:  OMNIPAQUE IOHEXOL 300 MG/ML  SOLN  COMPARISON:  None.  FINDINGS: Minimal dependent subsegmental atelectasis in the lung bases. 2 small adjacent pleural based nodules in the left lower lobe on image 3 measuring approximately 6 mm. No pleural effusions. Heart is normal size.  Liver, gallbladder, spleen, pancreas, adrenals and kidneys are unremarkable. Stomach, large and small bowel are unremarkable. Appendix is not visualized, but no secondary signs of appendicitis. Uterus, adnexae and urinary bladder are unremarkable. No free fluid, free air or adenopathy. Aorta is normal caliber.  No acute bony abnormality.  IMPRESSION: No acute findings in the abdomen or pelvis.  2 pleural-based nodules in the left lower lobe of approximately 6 mm. If the patient is at high risk for bronchogenic carcinoma, follow-up chest CT at 6-12 months is recommended. If the patient is at low risk for bronchogenic carcinoma, follow-up chest CT at 12 months is recommended. This recommendation follows the consensus statement: Guidelines for Management of Small Pulmonary Nodules Detected on CT Scans: A Statement from the Fleischner Society as published in Radiology 2005;237:395-400.   Electronically Signed   By: Charlett Nose M.D.   On: 03/14/2013 14:41    ROS: See chart Blood pressure 114/74, pulse 79, temperature 98.7 F (37.1 C), temperature source Oral, resp. rate 20, height 5\' 4"  (1.626 m), weight 52.708 kg (116 lb 3.2 oz), last menstrual period 02/12/2013, SpO2 96.00%. Physical Exam: Pleasant white female in no acute distress. Abdomen: Soft, flat. Mild tenderness to palpation in the suprapubic region, though  no specific right lower quadrant tenderness or rigidity.  Assessment/Plan: Impression: UTI/cystitis, improving. Given normalization of leukocytosis and significant relief of pain, I doubt she has acute appendicitis. No need for acute surgical intervention. I discussed this with the patient and husband. Plan: We'll advance to regular diet. Patient desires discharge today. I did give them instructions to return should her pain worsen over the next few days.  Taylon Louison A 03/15/2013, 7:56 AM

## 2013-03-16 LAB — URINE CULTURE: Colony Count: >=100000

## 2013-03-18 ENCOUNTER — Telehealth: Payer: Self-pay | Admitting: Family Medicine

## 2013-03-18 NOTE — Telephone Encounter (Signed)
Urine culture results available: E. Coli, sensitive to Cipro.  Was discharged on Cipro.  Brendia Sacks, MD [facility] [phone]

## 2014-09-29 ENCOUNTER — Emergency Department (HOSPITAL_COMMUNITY)
Admission: EM | Admit: 2014-09-29 | Discharge: 2014-09-29 | Disposition: A | Discharge status: Home / Self Care | Payer: TRICARE For Life (TFL) | Attending: Emergency Medicine | Admitting: Emergency Medicine

## 2014-09-29 ENCOUNTER — Emergency Department (HOSPITAL_COMMUNITY): Payer: TRICARE For Life (TFL)

## 2014-09-29 ENCOUNTER — Encounter (HOSPITAL_COMMUNITY): Payer: Self-pay

## 2014-09-29 DIAGNOSIS — Z720 Tobacco use: Secondary | ICD-10-CM | POA: Diagnosis not present

## 2014-09-29 DIAGNOSIS — Z79899 Other long term (current) drug therapy: Secondary | ICD-10-CM | POA: Diagnosis not present

## 2014-09-29 DIAGNOSIS — M5431 Sciatica, right side: Secondary | ICD-10-CM

## 2014-09-29 DIAGNOSIS — Z791 Long term (current) use of non-steroidal anti-inflammatories (NSAID): Secondary | ICD-10-CM | POA: Diagnosis not present

## 2014-09-29 DIAGNOSIS — Z880 Allergy status to penicillin: Secondary | ICD-10-CM | POA: Diagnosis not present

## 2014-09-29 DIAGNOSIS — M545 Low back pain: Secondary | ICD-10-CM | POA: Diagnosis present

## 2014-09-29 MED ORDER — CYCLOBENZAPRINE HCL 10 MG PO TABS
10.0000 mg | ORAL_TABLET | Freq: Once | ORAL | Status: AC
  Administered 2014-09-29: 10 mg via ORAL
  Filled 2014-09-29: qty 1

## 2014-09-29 MED ORDER — NAPROXEN 500 MG PO TABS
500.0000 mg | ORAL_TABLET | Freq: Two times a day (BID) | ORAL | Status: DC
Start: 2014-09-29 — End: 2015-08-28

## 2014-09-29 MED ORDER — CYCLOBENZAPRINE HCL 10 MG PO TABS: 10.0000 mg | ORAL_TABLET | Freq: Two times a day (BID) | ORAL | Status: DC | PRN

## 2014-09-29 MED ORDER — KETOROLAC TROMETHAMINE 60 MG/2ML IM SOLN
30.0000 mg | Freq: Once | INTRAMUSCULAR | Status: AC
  Administered 2014-09-29: 30 mg via INTRAMUSCULAR
  Filled 2014-09-29: qty 2

## 2014-09-29 NOTE — ED Provider Notes (Signed)
CSN: [id number]     Arrival date & time 09/29/14  1112 History   First MD Initiated Contact with Patient 09/29/14 1148     Chief Complaint  Patient presents with  . Back Pain     (Consider location/radiation/quality/duration/timing/severity/associated sxs/prior Treatment) Patient is a 54 y.o. female presenting with back pain. The history is provided by the patient.  Back Pain Location:  Lumbar spine Quality:  Shooting and burning Radiates to:  R posterior upper leg Pain severity:  Severe Onset quality:  Gradual Timing:  Constant Progression:  Worsening Chronicity:  New Relieved by:  Nothing Worsened by:  Ambulation and movement Ineffective treatments: excedrin. Associated symptoms: no bladder incontinence and no bowel incontinence    Kristina Edwards is a 54 y.o. female who presents to the ED with right lower back pain that radiates to the right leg. The pain started yesterday and has gotten worse. She denies loss of control of bladder or bowels. This is a new problem. She denies any known injury.   Past Medical History  Diagnosis Date  . Lung nodules     LLL incidental finding CT 10/14. repeat 6 months   Past Surgical History  Procedure Laterality Date  . Cesarean section    . Pleural scarification     No family history on file. History  Substance Use Topics  . Smoking status: Current Every Day Smoker -- 1.00 packs/day    Types: Cigarettes  . Smokeless tobacco: Not on file  . Alcohol Use: Yes     Comment: occ   OB History    No data available     Review of Systems  Gastrointestinal: Negative for bowel incontinence.  Genitourinary: Negative for bladder incontinence.  Musculoskeletal: Positive for back pain.  all other systems negative    Allergies  Clindamycin/lincomycin; Codeine; and Penicillins  Home Medications   Prior to Admission medications   Medication Sig Start Date End Date Taking? Authorizing Provider  aspirin-acetaminophen-caffeine (EXCEDRIN  MIGRAINE) [phone] MG per tablet Take 2 tablets by mouth every 6 (six) hours as needed for headache.   Yes Historical Provider, MD  ciprofloxacin (CIPRO) 500 MG tablet Take 1 tablet (500 mg total) by mouth 2 (two) times daily. First dose this evening at 6pm. Patient not taking: Reported on 09/29/2014 03/15/13   Standley Brooking, MD  cyclobenzaprine (FLEXERIL) 10 MG tablet Take 1 tablet (10 mg total) by mouth 2 (two) times daily as needed for muscle spasms. 09/29/14   Hope Orlene Och, NP  naproxen (NAPROSYN) 500 MG tablet Take 1 tablet (500 mg total) by mouth 2 (two) times daily. 09/29/14   Hope Orlene Och, NP   BP 120/81 mmHg  Pulse 86  Temp(Src) 97.9 F (36.6 C) (Oral)  Resp 18  Ht 5' 4.5" (1.638 m)  Wt 115 lb (52.164 kg)  BMI 19.44 kg/m2  SpO2 97%  LMP 02/12/2013 Physical Exam  Constitutional: She is oriented to person, place, and time. She appears well-developed and well-nourished. No distress.  Patient appears uncomfortable  HENT:  Head: Normocephalic and atraumatic.  Nose: Nose normal.  Eyes: EOM are normal.  Neck: Normal range of motion. Neck supple.  Cardiovascular: Normal rate and regular rhythm.   Pulmonary/Chest: Effort normal. She has no wheezes. She has no rales.  Abdominal: Soft. Bowel sounds are normal. There is no tenderness.  Musculoskeletal: Normal range of motion.       Lumbar back: She exhibits tenderness, pain and spasm. She exhibits normal pulse.  Neurological: She  is alert and oriented to person, place, and time. She has normal strength. No cranial nerve deficit or sensory deficit.  Reflex Scores:      Bicep reflexes are 2+ on the right side and 2+ on the left side.      Brachioradialis reflexes are 2+ on the right side and 2+ on the left side.      Patellar reflexes are 2+ on the right side and 2+ on the left side.      Achilles reflexes are 2+ on the right side and 2+ on the left side. Pain with ambulation.   Skin: Skin is warm and dry.  Psychiatric: She has a  normal mood and affect. Her behavior is normal.  Nursing note and vitals reviewed.   ED Course  Procedures (including critical care time) Labs Review Labs Reviewed - No data to display  Imaging Review Dg Lumbar Spine Complete  09/29/2014   CLINICAL DATA:  Right-sided lower back pain shooting down RIGHT leg. No known injury.  EXAM: LUMBAR SPINE - COMPLETE 4+ VIEW  COMPARISON:  None.  FINDINGS: There is no evidence of lumbar spine fracture. Alignment is normal. Intervertebral disc spaces are maintained. Mild atheromatous calcification.  IMPRESSION: Negative.   Electronically Signed   By: Davonna BellingJohn  Curnes M.D.   On: 09/29/2014 12:36   After flexeril and Toradol patient feeling better and is able to ambulate without as much pain.   MDM  54 y.o. female with right side low back pain that radiates to the right buttock. Stable for d/c without focal neuro deficits. Ambulatory on d/c without difficulty. Discussed with the patient clinical and x-ray findings and all questioned fully answered. She will return if any problems arise.  Final diagnoses:  Sciatica, right      [facility] Hospitalope M Neese, NP 09/29/14 1644  Donnetta HutchingBrian Cook, MD 09/30/14 1302

## 2014-09-29 NOTE — ED Notes (Signed)
Pt reports pain in r lower back radiating down r leg.  Reports hurts worse to put any weight on r leg.  Denies injury.  Denies any problems with controlling bowels or bladder.  Denies numbness or tingling.

## 2014-09-29 NOTE — Discharge Instructions (Signed)
Do not take the muscle relaxant if driving as it will make you sleepy. Follow up with Dr. Hilda LiasKeeling if symptoms persist.

## 2014-09-29 NOTE — ED Notes (Signed)
Pt verbalized understanding of no driving and to use caution within 4 hours of taking pain meds due to meds cause drowsiness 

## 2015-08-28 ENCOUNTER — Emergency Department (HOSPITAL_COMMUNITY)

## 2015-08-28 ENCOUNTER — Emergency Department (HOSPITAL_COMMUNITY)
Admission: EM | Admit: 2015-08-28 | Discharge: 2015-08-28 | Disposition: A | Discharge status: Home / Self Care | Attending: Emergency Medicine | Admitting: Emergency Medicine

## 2015-08-28 ENCOUNTER — Encounter (HOSPITAL_COMMUNITY): Payer: Self-pay | Admitting: *Deleted

## 2015-08-28 DIAGNOSIS — Z79899 Other long term (current) drug therapy: Secondary | ICD-10-CM | POA: Diagnosis not present

## 2015-08-28 DIAGNOSIS — Z79891 Long term (current) use of opiate analgesic: Secondary | ICD-10-CM | POA: Diagnosis not present

## 2015-08-28 DIAGNOSIS — M545 Low back pain, unspecified: Secondary | ICD-10-CM

## 2015-08-28 DIAGNOSIS — F17210 Nicotine dependence, cigarettes, uncomplicated: Secondary | ICD-10-CM | POA: Diagnosis not present

## 2015-08-28 DIAGNOSIS — M549 Dorsalgia, unspecified: Secondary | ICD-10-CM

## 2015-08-28 HISTORY — DX: Other chronic pain: G89.29

## 2015-08-28 HISTORY — DX: Radiculopathy, lumbar region: M54.16

## 2015-08-28 HISTORY — DX: Dorsalgia, unspecified: M54.9

## 2015-08-28 LAB — URINALYSIS, ROUTINE W REFLEX MICROSCOPIC
BILIRUBIN URINE: NEGATIVE
Glucose, UA: NEGATIVE mg/dL
Hgb urine dipstick: NEGATIVE
Ketones, ur: NEGATIVE mg/dL
Leukocytes, UA: NEGATIVE
NITRITE: NEGATIVE
PH: 6.5 (ref 5.0–8.0)
Protein, ur: NEGATIVE mg/dL
Specific Gravity, Urine: 1.01 (ref 1.005–1.030)

## 2015-08-28 MED ORDER — HYDROCODONE-ACETAMINOPHEN 5-325 MG PO TABS: ORAL_TABLET | ORAL | Status: DC

## 2015-08-28 MED ORDER — METHOCARBAMOL 500 MG PO TABS: 1000.0000 mg | ORAL_TABLET | Freq: Four times a day (QID) | ORAL | Status: DC | PRN

## 2015-08-28 MED ORDER — NAPROXEN 250 MG PO TABS: 250.0000 mg | ORAL_TABLET | Freq: Two times a day (BID) | ORAL | Status: DC | PRN

## 2015-08-28 MED ORDER — HYDROMORPHONE HCL 1 MG/ML IJ SOLN
1.0000 mg | Freq: Once | INTRAMUSCULAR | Status: AC
  Administered 2015-08-28: 1 mg via INTRAMUSCULAR
  Filled 2015-08-28: qty 1

## 2015-08-28 NOTE — Discharge Instructions (Signed)
Take the prescriptions as directed.  Apply moist heat or ice to the area(s) of discomfort, for 15 minutes at a time, several times per day for the next few days.  Do not fall asleep on a heating or ice pack.  Call your regular medical doctor on Monday to schedule a follow up appointment in the next 3 days.  Return to the Emergency Department immediately if worsening. ° °

## 2015-08-28 NOTE — ED Notes (Signed)
MD at bedside. 

## 2015-08-28 NOTE — ED Notes (Signed)
Pt comes in with back pain, states she is having swelling in her lower back and around her front lower abdomen. Pt denies any injury. Pt sates her pain is worse with movement.

## 2015-08-28 NOTE — ED Provider Notes (Signed)
CSN: [id number]     Arrival date & time 08/28/15  1206 History   First MD Initiated Contact with Patient 08/28/15 1445     Chief Complaint  Patient presents with  . Back Pain      HPI Pt was seen at 1455. Per pt, c/o sudden onset and persistence of constant left sided low back "pain" that began several days ago.  Pt describes the pain as "sharp," and radiating into the left side of her abd.  Has been associated with no other symptoms. Pain worsens with palpation of the area and body position changes. Endorses hx of LBP with radiculopathy, and states "but this feels different." Denies vaginal bleeding/discharge, no dysuria/hematuria, no abd pain, no N/V, no diarrhea, no black or blood in stools, no CP/SOB, no fevers, no rash, no injury. Denies incont/retention of bowel or bladder, no saddle anesthesia, no focal motor weakness, no tingling/numbness in extremities.     Past Medical History  Diagnosis Date  . Lung nodules     LLL incidental finding CT 10/14. repeat 6 months  . Chronic back pain   . Lumbar radiculopathy    Past Surgical History  Procedure Laterality Date  . Cesarean section    . Pleural scarification      Social History  Substance Use Topics  . Smoking status: Current Every Day Smoker -- 1.00 packs/day    Types: Cigarettes  . Smokeless tobacco: None  . Alcohol Use: Yes     Comment: occ    Review of Systems ROS: Statement: All systems negative except as marked or noted in the HPI; Constitutional: Negative for fever and chills. ; ; Eyes: Negative for eye pain, redness and discharge. ; ; ENMT: Negative for ear pain, hoarseness, nasal congestion, sinus pressure and sore throat. ; ; Cardiovascular: Negative for chest pain, palpitations, diaphoresis, dyspnea and peripheral edema. ; ; Respiratory: Negative for cough, wheezing and stridor. ; ; Gastrointestinal: Negative for nausea, vomiting, diarrhea, abdominal pain, blood in stool, hematemesis, jaundice and rectal bleeding. .  ; ; Genitourinary: Negative for dysuria, flank pain and hematuria. ; ; Musculoskeletal: +LBP. Negative for neck pain. Negative for swelling and trauma.; ; GYN:  No pelvic pain, no vaginal bleeding, no vaginal discharge, no vulvar pain. ;; Skin: Negative for pruritus, rash, abrasions, blisters, bruising and skin lesion.; ; Neuro: Negative for headache, lightheadedness and neck stiffness. Negative for weakness, altered level of consciousness , altered mental status, extremity weakness, paresthesias, involuntary movement, seizure and syncope.      Allergies  Clindamycin/lincomycin; Codeine; and Penicillins  Home Medications   Prior to Admission medications   Medication Sig Start Date End Date Taking? Authorizing Provider  cyclobenzaprine (FLEXERIL) 10 MG tablet Take 1 tablet (10 mg total) by mouth 2 (two) times daily as needed for muscle spasms. 09/29/14  Yes Hope Orlene Och, NP  naproxen (NAPROSYN) 500 MG tablet Take 1 tablet (500 mg total) by mouth 2 (two) times daily. 09/29/14  Yes Hope Orlene Och, NP   BP 143/84 mmHg  Pulse 97  Temp(Src) 97.7 F (36.5 C) (Oral)  Resp 16  Ht  (1.626 m)  Wt 115 lb (52.164 kg)  BMI 19.73 kg/m2  SpO2 100%  LMP 02/12/2013 Physical Exam  1500: Physical examination:  Nursing notes reviewed; Vital signs and O2 SAT reviewed;  Constitutional: Well developed, Well nourished, Well hydrated, In no acute distress; Head:  Normocephalic, atraumatic; Eyes: EOMI, PERRL, No scleral icterus; ENMT: Mouth and pharynx normal, Mucous membranes moist; Neck:  Supple, Full range of motion, No lymphadenopathy; Cardiovascular: Regular rate and rhythm, No murmur, rub, or gallop; Respiratory: Breath sounds clear & equal bilaterally, No rales, rhonchi, wheezes.  Speaking full sentences with ease, Normal respiratory effort/excursion; Chest: Nontender, Movement normal; Abdomen: Soft, Nontender, Nondistended, Normal bowel sounds; Genitourinary: No CVA tenderness; Spine:  No midline CS, TS, LS  tenderness. +TTP left lumbar paraspinal muscles.;; Extremities: Pulses normal, No tenderness, No edema, No calf edema or asymmetry.; Neuro: AA&Ox3, Major CN grossly intact.  Speech clear. No gross focal motor or sensory deficits in extremities. Strength 5/5 equal bilat UE's and LE's, including great toe dorsiflexion.  DTR 2/4 equal bilat UE's and LE's.  No gross sensory deficits.  Neg straight leg raises bilat.; Skin: Color normal, Warm, Dry.   ED Course  Procedures (including critical care time) Labs Review  Imaging Review  I have personally reviewed and evaluated these images and lab results as part of my medical decision-making.   EKG Interpretation None      MDM  MDM Reviewed: previous chart, nursing note and vitals Reviewed previous: labs Interpretation: labs and CT scan     Results for orders placed or performed during the hospital encounter of 08/28/15  Urinalysis, Routine w reflex microscopic  Result Value Ref Range   Color, Urine YELLOW YELLOW   APPearance CLEAR CLEAR   Specific Gravity, Urine 1.010 1.005 - 1.030   pH 6.5 5.0 - 8.0   Glucose, UA NEGATIVE NEGATIVE mg/dL   Hgb urine dipstick NEGATIVE NEGATIVE   Bilirubin Urine NEGATIVE NEGATIVE   Ketones, ur NEGATIVE NEGATIVE mg/dL   Protein, ur NEGATIVE NEGATIVE mg/dL   Nitrite NEGATIVE NEGATIVE   Leukocytes, UA NEGATIVE NEGATIVE   Ct Renal Stone Study 08/28/2015  CLINICAL DATA:  Low back and right lower quadrant abdominal pain for 3 days. EXAM: CT ABDOMEN AND PELVIS WITHOUT CONTRAST TECHNIQUE: Multidetector CT imaging of the abdomen and pelvis was performed following the standard protocol without IV contrast. COMPARISON:  CT scan of March 14, 2013. FINDINGS: Stable subpleural nodules are seen laterally in left lung base and can be considered benign at this point. No other abnormality seen in visualized lung bases. No significant osseous abnormality is noted. No gallstones are noted. No focal abnormality is noted in  the liver, spleen or pancreas on these unenhanced images. Adrenal glands and kidneys appear normal. No hydronephrosis or renal obstruction is noted. No renal or ureteral calculi are noted. There is no evidence of bowel obstruction. There is no abnormal fluid collection. Urinary bladder appears normal. Uterus is unremarkable. Ovaries are not well visualized. No significant adenopathy is noted. IMPRESSION: No acute abnormality seen in the abdomen or pelvis. Electronically Signed   By: Lupita RaiderJames  Green Jr, M.D.   On: 08/28/2015 15:46    1630:  Workup reassuring. Tx symptomatically at this time. Dx and testing d/w pt and family.  Questions answered.  Verb understanding, agreeable to d/c home with outpt f/u.   Samuel JesterKathleen Mischele Detter, DO 09/01/15 1056

## 2016-05-14 ENCOUNTER — Emergency Department (HOSPITAL_COMMUNITY)

## 2016-05-14 ENCOUNTER — Encounter (HOSPITAL_COMMUNITY): Payer: Self-pay | Admitting: Emergency Medicine

## 2016-05-14 ENCOUNTER — Emergency Department (HOSPITAL_COMMUNITY)
Admission: EM | Admit: 2016-05-14 | Discharge: 2016-05-14 | Disposition: A | Discharge status: Home / Self Care | Attending: Emergency Medicine | Admitting: Emergency Medicine

## 2016-05-14 DIAGNOSIS — M5442 Lumbago with sciatica, left side: Secondary | ICD-10-CM | POA: Insufficient documentation

## 2016-05-14 DIAGNOSIS — F17210 Nicotine dependence, cigarettes, uncomplicated: Secondary | ICD-10-CM | POA: Insufficient documentation

## 2016-05-14 DIAGNOSIS — M5432 Sciatica, left side: Secondary | ICD-10-CM

## 2016-05-14 DIAGNOSIS — M545 Low back pain: Secondary | ICD-10-CM | POA: Diagnosis present

## 2016-05-14 MED ORDER — CYCLOBENZAPRINE HCL 10 MG PO TABS: 10.0000 mg | ORAL_TABLET | Freq: Three times a day (TID) | ORAL | 0 refills | Status: DC

## 2016-05-14 MED ORDER — DEXAMETHASONE 4 MG PO TABS: 4.0000 mg | ORAL_TABLET | Freq: Two times a day (BID) | ORAL | 0 refills | Status: DC

## 2016-05-14 MED ORDER — DICLOFENAC SODIUM 75 MG PO TBEC: 75.0000 mg | DELAYED_RELEASE_TABLET | Freq: Two times a day (BID) | ORAL | 0 refills | Status: DC

## 2016-05-14 MED ORDER — KETOROLAC TROMETHAMINE 60 MG/2ML IM SOLN
60.0000 mg | Freq: Once | INTRAMUSCULAR | Status: AC
  Administered 2016-05-14: 60 mg via INTRAMUSCULAR
  Filled 2016-05-14: qty 2

## 2016-05-14 MED ORDER — DIAZEPAM 5 MG PO TABS
5.0000 mg | ORAL_TABLET | Freq: Once | ORAL | Status: AC
  Administered 2016-05-14: 5 mg via ORAL
  Filled 2016-05-14: qty 1

## 2016-05-14 MED ORDER — ONDANSETRON HCL 4 MG PO TABS
4.0000 mg | ORAL_TABLET | Freq: Once | ORAL | Status: AC
  Administered 2016-05-14: 4 mg via ORAL
  Filled 2016-05-14: qty 1

## 2016-05-14 MED ORDER — MORPHINE SULFATE (PF) 4 MG/ML IV SOLN
8.0000 mg | Freq: Once | INTRAVENOUS | Status: AC
Start: 2016-05-14 — End: 2016-05-14
  Administered 2016-05-14: 8 mg via INTRAMUSCULAR
  Filled 2016-05-14: qty 2

## 2016-05-14 NOTE — Discharge Instructions (Signed)
The x-ray of your hip is negative for acute problem. The x-ray of your lumbar spine is negative for acute problem. There is question of some arthritis changes. Heating pad to your back and leg may be helpful. Please use diclofenac, Decadron, and Flexeril for your discomfort. Please rest your back and your leg is much as possible. Please see Dr. Luiz BlareGraves, or the orthopedic specialist of your choice for orthopedic evaluation and possible MRI concerning her back.

## 2016-05-14 NOTE — ED Triage Notes (Signed)
Pt reports sciatica on her L side, flared up yesterday. No known injury. Pt states she took a muscle relaxer that she was prescribed last time she was here. Pt denies problems with bowel or bladder.

## 2016-05-14 NOTE — ED Provider Notes (Signed)
[facility]EMER[facility]Y DEPT Provider Note   CSN: [id number] Arrival date & time: 05/14/16  1831  By signing my name below, I, Doreatha MartinEva Mathews, attest that this documentation has been prepared under the direction and in the presence of  Ivery QualeHobson Anjelo Pullman, PA-C. Electronically Signed: Doreatha MartinEva Mathews, ED Scribe. 05/14/16. 7:26 PM.    History   Chief Complaint Chief Complaint  Patient presents with  . Back Pain    HPI Kristina Edwards is a 55 y.o. female who presents to the Emergency Department complaining of moderate, worsening, throbbing left lower back pain with radiation to the left leg that began 2 days ago and worsened this morning. 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 Pt states she tried to rest in bed without relief of pain. She states her pain is worsened with weight bearing, movement and ambulation. Pt was last seen in the ED for similar pain on 08/28/15, work up was reassuring and she was given rx for Naprosyn and Robaxin. She reports she took Robaxin left over from this visit and took it with temporary relief of pain. She reports her pain is similar to this episode of sciatic pain, but radiates to the opposite leg. No h/o back surgery. She denies numbness.   The history is provided by the patient. No language interpreter was used.  Back Pain   This is a recurrent problem. The current episode started 2 days ago. The problem occurs constantly. The problem has been gradually worsening. The pain is associated with no known injury. The pain is present in the gluteal region. Quality: throbbing. The pain is moderate. The symptoms are aggravated by certain positions. Associated symptoms include leg pain. Pertinent negatives include no numbness. She has tried muscle relaxants and bed rest for the symptoms. The treatment provided mild relief.    Past Medical History:  Diagnosis Date  . Chronic back pain   . Lumbar radiculopathy   . Lung nodules    LLL  incidental finding CT 10/14. repeat 6 months    Patient Active Problem List   Diagnosis Date Noted  . UTI (lower urinary tract infection) 03/14/2013  . Abdominal pain 03/14/2013  . Leukocytosis 03/14/2013  . Lung nodules 03/14/2013    Past Surgical History:  Procedure Laterality Date  . CESAREAN SECTION    . PLEURAL SCARIFICATION      OB History    Gravida Para Term Preterm AB Living   3 3 3          SAB TAB Ectopic Multiple Live Births                   Home Medications    Prior to Admission medications   Medication Sig Start Date End Date Taking? Authorizing Provider  cyclobenzaprine (FLEXERIL) 10 MG tablet Take 1 tablet (10 mg total) by mouth 2 (two) times daily as needed for muscle spasms. 09/29/14   Hope Orlene OchM Neese, NP  HYDROcodone-acetaminophen (NORCO/VICODIN) 5-325 MG tablet 1 or 2 tabs PO q6 hours prn pain 08/28/15   Samuel JesterKathleen McManus, DO  methocarbamol (ROBAXIN) 500 MG tablet Take 2 tablets (1,000 mg total) by mouth 4 (four) times daily as needed for muscle spasms (muscle spasm/pain). 08/28/15   Samuel JesterKathleen McManus, DO  naproxen (NAPROSYN) 250 MG tablet Take 1 tablet (250 mg total) by mouth 2 (two) times daily as needed for mild pain or moderate pain (take with food). 08/28/15   Samuel JesterKathleen McManus, DO    Family  History Family History  Problem Relation Age of Onset  . Cancer - Lung Father     Social History Social History  Substance Use Topics  . Smoking status: Current Every Day Smoker    Packs/day: 1.00    Types: Cigarettes  . Smokeless tobacco: Never Used  . Alcohol use Yes     Comment: occ     Allergies   Clindamycin/lincomycin; Codeine; and Penicillins   Review of Systems Review of Systems  Musculoskeletal: Positive for back pain.  Neurological: Negative for numbness.  All other systems reviewed and are negative.   Physical Exam Updated Vital Signs BP 147/89 (BP Location: Right Arm)   Pulse 108   Temp 98.6 F (37 C) (Oral)   Resp 18   Ht 5\' 4"  (1.626  m)   Wt 110 lb (49.9 kg)   LMP 02/12/2013   SpO2 99%   BMI 18.88 kg/m   Physical Exam  Constitutional: She appears well-developed and well-nourished.  HENT:  Head: Normocephalic.  Eyes: Conjunctivae are normal.  Cardiovascular: Normal rate, regular rhythm and normal heart sounds.   Pulmonary/Chest: Effort normal and breath sounds normal. No respiratory distress. She has no wheezes. She has no rales.  Abdominal: She exhibits no distension.  Musculoskeletal: Normal range of motion. She exhibits tenderness.  No palpable step-off of the lumbar spine. There are no hot areas appreciated. There is pain to the paraspinal muscle area of the lumbar spine region. No palpable step-off of the cervical spine.   Neurological: She is alert.  Skin: Skin is warm and dry.  Psychiatric: She has a normal mood and affect. Her behavior is normal.  Nursing note and vitals reviewed.    ED Treatments / Results   DIAGNOSTIC STUDIES: Oxygen Saturation is 99% on RA, normal by my interpretation.    COORDINATION OF CARE: 7:23 PM Discussed treatment plan with pt at bedside which includes XR and pt agreed to plan.    Labs (all labs ordered are listed, but only abnormal results are displayed) Labs Reviewed - No data to display  EKG  EKG Interpretation None       Radiology No results found.  Procedures Procedures (including critical care time)  Medications Ordered in ED Medications - No data to display   Initial Impression / Assessment and Plan / ED Course  I have reviewed the triage vital signs and the nursing notes.  Pertinent labs & imaging results that were available during my care of the patient were reviewed by me and considered in my medical decision making (see chart for details).  Clinical Course    MDM. XR lumbar spine *negatiave for acute problem.**. No gross neuro deficit noted.Pt will be sent home with muscle relaxer, pain medication.   Final Clinical Impressions(s) / ED  Diagnoses  Patient noticed some improvement after pain medication. She can turn in the bed without excruciating pain.  I reviewed the x-ray findings with the patient and her husband in terms which they understand. Patient will be referred to Dr. Luiz Blare for orthopedic evaluation and possible MRI. Prescriptions for muscle relaxer and anti-inflammatory medication,  given to the patient.    Final diagnoses:  Sciatica of left side    New Prescriptions New Prescriptions   CYCLOBENZAPRINE (FLEXERIL) 10 MG TABLET    Take 1 tablet (10 mg total) by mouth 3 (three) times daily.   DEXAMETHASONE (DECADRON) 4 MG TABLET    Take 1 tablet (4 mg total) by mouth 2 (two) times daily with  a meal.   DICLOFENAC (VOLTAREN) 75 MG EC TABLET    Take 1 tablet (75 mg total) by mouth 2 (two) times daily.    **I personally performed the services described in this documentation, which was scribed in my presence. The recorded information has been reviewed and is accurate.Ivery Quale*   Nayson Traweek, PA-C 05/14/16 2101    Samuel JesterKathleen McManus, DO 05/17/16 [phone]

## 2016-10-27 ENCOUNTER — Emergency Department (HOSPITAL_COMMUNITY)
Admission: EM | Admit: 2016-10-27 | Discharge: 2016-10-28 | Disposition: A | Discharge status: Home / Self Care | Attending: Emergency Medicine | Admitting: Emergency Medicine

## 2016-10-27 ENCOUNTER — Encounter (HOSPITAL_COMMUNITY): Payer: Self-pay | Admitting: *Deleted

## 2016-10-27 ENCOUNTER — Emergency Department (HOSPITAL_COMMUNITY)

## 2016-10-27 DIAGNOSIS — R091 Pleurisy: Secondary | ICD-10-CM | POA: Diagnosis not present

## 2016-10-27 DIAGNOSIS — F17210 Nicotine dependence, cigarettes, uncomplicated: Secondary | ICD-10-CM | POA: Insufficient documentation

## 2016-10-27 DIAGNOSIS — Z79899 Other long term (current) drug therapy: Secondary | ICD-10-CM | POA: Diagnosis not present

## 2016-10-27 DIAGNOSIS — R0789 Other chest pain: Secondary | ICD-10-CM | POA: Diagnosis present

## 2016-10-27 NOTE — ED Triage Notes (Signed)
Pain in left lateral rib area, worse when breathing deeply

## 2016-10-27 NOTE — ED Provider Notes (Signed)
[facility] Provider Note   CSN: [id number] Arrival date & time: 10/27/16  2202     History   Chief Complaint Chief Complaint  Patient presents with  . Chest Pain    HPI Kristina Edwards is a 56 y.o. female.  HPI  The patient is a 56 year old female, she does have a history of prior pneumothorax on the left which she states was spontaneous. She denies any other significant long-term medical problems. She states that yesterday she started to have some respiratory-related chest pain on the left side. This is persistent when she takes deep breaths, it goes away when she is resting and breathing shallowly. No others coughing, fever or respiratory symptoms. No swelling of the legs. No meds given prior to arrival. Symptoms seem to be more intense today than they were yesterday. The pain is sharp and stabbing.  Past Medical History:  Diagnosis Date  . Chronic back pain   . Lumbar radiculopathy   . Lung nodules    LLL incidental finding CT 10/14. repeat 6 months    Patient Active Problem List   Diagnosis Date Noted  . UTI (lower urinary tract infection) 03/14/2013  . Abdominal pain 03/14/2013  . Leukocytosis 03/14/2013  . Lung nodules 03/14/2013    Past Surgical History:  Procedure Laterality Date  . CESAREAN SECTION    . PLEURAL SCARIFICATION      OB History    Gravida Para Term Preterm AB Living   3 3 3          SAB TAB Ectopic Multiple Live Births                   Home Medications    Prior to Admission medications   Medication Sig Start Date End Date Taking? Authorizing Provider  cyclobenzaprine (FLEXERIL) 10 MG tablet Take 1 tablet (10 mg total) by mouth 3 (three) times daily. 05/14/16   Ivery Quale, PA-C  dexamethasone (DECADRON) 4 MG tablet Take 1 tablet (4 mg total) by mouth 2 (two) times daily with a meal. 05/14/16   Ivery Quale, PA-C  diclofenac (VOLTAREN) 75 MG EC tablet Take 1 tablet (75 mg total) by mouth 2 (two) times daily. 05/14/16    Ivery Quale, PA-C  HYDROcodone-acetaminophen (NORCO/VICODIN) 5-325 MG tablet 1 or 2 tabs PO q6 hours prn pain 08/28/15   Samuel Jester, DO  ibuprofen (ADVIL,MOTRIN) 600 MG tablet Take 1 tablet (600 mg total) by mouth every 6 (six) hours as needed. 10/28/16   Eber Hong, MD  methocarbamol (ROBAXIN) 500 MG tablet Take 2 tablets (1,000 mg total) by mouth 4 (four) times daily as needed for muscle spasms (muscle spasm/pain). 08/28/15   Samuel Jester, DO  naproxen (NAPROSYN) 250 MG tablet Take 1 tablet (250 mg total) by mouth 2 (two) times daily as needed for mild pain or moderate pain (take with food). 08/28/15   Samuel Jester, DO    Family History Family History  Problem Relation Age of Onset  . Cancer - Lung Father     Social History Social History  Substance Use Topics  . Smoking status: Current Every Day Smoker    Packs/day: 1.00    Types: Cigarettes  . Smokeless tobacco: Never Used  . Alcohol use Yes     Comment: occ     Allergies   Clindamycin/lincomycin; Codeine; and Penicillins   Review of Systems Review of Systems  All other systems reviewed and are negative.    Physical Exam Updated Vital Signs BP Marland Kitchen)  154/80   Pulse 62   Temp 98.3 F (36.8 C) (Oral)   Resp 18   Ht 5\' 5"  (1.651 m)   Wt 52.2 kg (115 lb)   LMP 02/12/2013   SpO2 98%   BMI 19.14 kg/m   Physical Exam  Constitutional: She appears well-developed and well-nourished. No distress.  HENT:  Head: Normocephalic and atraumatic.  Mouth/Throat: Oropharynx is clear and moist. No oropharyngeal exudate.  Eyes: Conjunctivae and EOM are normal. Pupils are equal, round, and reactive to light. Right eye exhibits no discharge. Left eye exhibits no discharge. No scleral icterus.  Neck: Normal range of motion. Neck supple. No JVD present. No thyromegaly present.  Cardiovascular: Normal rate, regular rhythm, normal heart sounds and intact distal pulses.  Exam reveals no gallop and no friction rub.   No  murmur heard. Pulmonary/Chest: Effort normal and breath sounds normal. No respiratory distress. She has no wheezes. She has no rales. She exhibits tenderness ( ttp over the L chest wall).  Abdominal: Soft. Bowel sounds are normal. She exhibits no distension and no mass. There is no tenderness.  Musculoskeletal: Normal range of motion. She exhibits no edema or tenderness.  Lymphadenopathy:    She has no cervical adenopathy.  Neurological: She is alert. Coordination normal.  Skin: Skin is warm and dry. No rash noted. No erythema.  Psychiatric: She has a normal mood and affect. Her behavior is normal.  Nursing note and vitals reviewed.   ED Treatments / Results  Labs (all labs ordered are listed, but only abnormal results are displayed) Labs Reviewed - No data to display  EKG  EKG Interpretation  Date/Time:  Friday October 27 2016 23:23:03 EDT Ventricular Rate:  61 PR Interval:    QRS Duration: 89 QT Interval:  461 QTC Calculation: 465 R Axis:   72 Text Interpretation:  Sinus rhythm RSR' in V1 or V2, probably normal variant Nonspecific T abnrm, anterolateral leads since 8/13, no changes seen Confirmed by Eber Hong ([id number]) on 10/27/2016 11:29:37 PM       Radiology Dg Chest 2 View  Result Date: 10/27/2016 CLINICAL DATA:  Acute onset of left lateral rib pain. Initial encounter. EXAM: CHEST  2 VIEW COMPARISON:  Chest radiograph performed 09/29/2016 FINDINGS: The lungs are well-aerated and clear. There is no evidence of focal opacification, pleural effusion or pneumothorax. The heart is normal in size; the mediastinal contour is within normal limits. No acute osseous abnormalities are seen. IMPRESSION: No acute cardiopulmonary process seen. No displaced rib fractures identified. Electronically Signed   By: Roanna Raider M.D.   On: 10/27/2016 23:56    Procedures Procedures (including critical care time)  Medications Ordered in ED Medications - No data to display   Initial  Impression / Assessment and Plan / ED Course  I have reviewed the triage vital signs and the nursing notes.  Pertinent labs & imaging results that were available during my care of the patient were reviewed by me and considered in my medical decision making (see chart for details).     Xray normal No signs of ptx / pna Pt informed Motrin and home Pt agreeable  Vitals:   10/27/16 2218 10/27/16 2220  BP:  (!) 154/80  Pulse:  62  Resp:  18  Temp:  98.3 F (36.8 C)  TempSrc:  Oral  SpO2:  98%  Weight: 52.2 kg (115 lb)   Height: 5\' 5"  (1.651 m)      Final Clinical Impressions(s) / ED Diagnoses  Final diagnoses:  Pleurisy    New Prescriptions New Prescriptions   IBUPROFEN (ADVIL,MOTRIN) 600 MG TABLET    Take 1 tablet (600 mg total) by mouth every 6 (six) hours as needed.     Eber HongMiller, Chasitie Passey, MD 10/28/16 [id number]

## 2016-10-28 MED ORDER — IBUPROFEN 600 MG PO TABS: 600.0000 mg | ORAL_TABLET | Freq: Four times a day (QID) | ORAL | 0 refills | Status: DC | PRN

## 2016-10-28 NOTE — Discharge Instructions (Signed)
Your xray is normal -  Motrin every 6 hours as needed for pain  ER for severe or worsening symptoms.

## 2017-08-26 ENCOUNTER — Encounter (HOSPITAL_COMMUNITY): Payer: Self-pay | Admitting: Emergency Medicine

## 2017-08-26 ENCOUNTER — Emergency Department (HOSPITAL_COMMUNITY)

## 2017-08-26 ENCOUNTER — Emergency Department (HOSPITAL_COMMUNITY)
Admission: EM | Admit: 2017-08-26 | Discharge: 2017-08-26 | Disposition: A | Discharge status: Home / Self Care | Attending: Emergency Medicine | Admitting: Emergency Medicine

## 2017-08-26 ENCOUNTER — Other Ambulatory Visit: Payer: Self-pay

## 2017-08-26 DIAGNOSIS — M25551 Pain in right hip: Secondary | ICD-10-CM

## 2017-08-26 DIAGNOSIS — R072 Precordial pain: Secondary | ICD-10-CM | POA: Diagnosis not present

## 2017-08-26 DIAGNOSIS — R079 Chest pain, unspecified: Secondary | ICD-10-CM

## 2017-08-26 DIAGNOSIS — M5431 Sciatica, right side: Secondary | ICD-10-CM | POA: Insufficient documentation

## 2017-08-26 DIAGNOSIS — F17210 Nicotine dependence, cigarettes, uncomplicated: Secondary | ICD-10-CM | POA: Insufficient documentation

## 2017-08-26 DIAGNOSIS — Z79899 Other long term (current) drug therapy: Secondary | ICD-10-CM | POA: Insufficient documentation

## 2017-08-26 DIAGNOSIS — M545 Low back pain: Secondary | ICD-10-CM | POA: Insufficient documentation

## 2017-08-26 HISTORY — DX: Sciatica, unspecified side: M54.30

## 2017-08-26 LAB — CBC WITH DIFFERENTIAL/PLATELET
BASOS ABS: 0 10*3/uL (ref 0.0–0.1)
BASOS PCT: 0 %
EOS ABS: 0.1 10*3/uL (ref 0.0–0.7)
Eosinophils Relative: 1 %
HCT: 41.5 % (ref 36.0–46.0)
HEMOGLOBIN: 13.3 g/dL (ref 12.0–15.0)
LYMPHS ABS: 1 10*3/uL (ref 0.7–4.0)
Lymphocytes Relative: 9 %
MCH: 29.8 pg (ref 26.0–34.0)
MCHC: 32 g/dL (ref 30.0–36.0)
MCV: 93 fL (ref 78.0–100.0)
Monocytes Absolute: 0.9 10*3/uL (ref 0.1–1.0)
Monocytes Relative: 8 %
NEUTROS PCT: 82 %
Neutro Abs: 9.5 10*3/uL — ABNORMAL HIGH (ref 1.7–7.7)
Platelets: 262 10*3/uL (ref 150–400)
RBC: 4.46 MIL/uL (ref 3.87–5.11)
RDW: 12.7 % (ref 11.5–15.5)
WBC: 11.6 10*3/uL — AB (ref 4.0–10.5)

## 2017-08-26 LAB — BASIC METABOLIC PANEL
Anion gap: 9 (ref 5–15)
BUN: 13 mg/dL (ref 6–20)
CHLORIDE: 103 mmol/L (ref 101–111)
CO2: 23 mmol/L (ref 22–32)
CREATININE: 0.71 mg/dL (ref 0.44–1.00)
Calcium: 8.9 mg/dL (ref 8.9–10.3)
GFR calc Af Amer: >60 mL/min (ref >60)
GFR calc non Af Amer: >60 mL/min (ref >60)
Glucose, Bld: 114 mg/dL — ABNORMAL HIGH (ref 65–99)
Potassium: 3.6 mmol/L (ref 3.5–5.1)
Sodium: 135 mmol/L (ref 135–145)

## 2017-08-26 LAB — TROPONIN I

## 2017-08-26 MED ORDER — PREDNISONE 10 MG PO TABS: ORAL_TABLET | ORAL | 0 refills | Status: DC

## 2017-08-26 MED ORDER — TRAMADOL HCL 50 MG PO TABS
50.0000 mg | ORAL_TABLET | Freq: Once | ORAL | Status: AC
  Administered 2017-08-26: 50 mg via ORAL
  Filled 2017-08-26: qty 1

## 2017-08-26 MED ORDER — KETOROLAC TROMETHAMINE 60 MG/2ML IM SOLN
60.0000 mg | Freq: Once | INTRAMUSCULAR | Status: AC
Start: 2017-08-26 — End: 2017-08-26
  Administered 2017-08-26: 60 mg via INTRAMUSCULAR
  Filled 2017-08-26: qty 2

## 2017-08-26 MED ORDER — CYCLOBENZAPRINE HCL 10 MG PO TABS: 10.0000 mg | ORAL_TABLET | Freq: Three times a day (TID) | ORAL | 0 refills | Status: AC | PRN

## 2017-08-26 MED ORDER — TRAMADOL HCL 50 MG PO TABS: 50.0000 mg | ORAL_TABLET | Freq: Four times a day (QID) | ORAL | 0 refills | Status: DC | PRN

## 2017-08-26 NOTE — Discharge Instructions (Addendum)
Alternate with ice and heat to your lower back.  Contact 1 of the providers listed to establish primary care.  Return here for any worsening symptoms.  [facility]    Kari BaarsEdward Hawkins MD. Specialty: Pulmonary Disease Contact information: [address]  [address]  [place] [address]  [phone]   Syliva OvermanMargaret Simpson, MD. Specialty: [facility] Contact information: [address]  [place] [address]  [phone]   Lilyan PuntScott Luking, MD. Specialty: [facility] Contact information: [address]  [address]  [place] [address]  [phone]   Avon Gullyesfaye Fanta, MD Specialty: Internal Medicine Contact information: [address]  [place] [address]  [phone]   Catalina PizzaZach Hall, MD. Specialty: Internal Medicine Contact information: [address]  [place] [address]  [phone]    [facility] (Dr. Selena BattenKim) Specialty: Family Medicine Contact information: [address]  [place] [address]  [phone]   John GiovanniStephen Knowlton, MD. Specialty: [facility] Contact information: [address]  [address]  [place] [address]  [phone]   Carylon Perchesoy Fagan, MD. Specialty: Internal Medicine Contact information: [address]  [address]  [place] [address]  [phone]    [facility]  [address] [place] [address] [phone]  Services [facility] offers a variety of basic health services.  Services include but are not limited to: Blood pressure checks  Heart rate checks  Blood sugar checks  Urine analysis  Rapid strep tests  Pregnancy tests.  Health education and referrals  People needing more complex services will be directed to a physician online. Using these virtual visits, doctors can evaluate and prescribe medicine and treatments. There will be no medication on-site, though [facility] will help  patients fill their prescriptions at little to no cost.   For More information please go to: [email or web address]

## 2017-08-26 NOTE — ED Provider Notes (Signed)
[facility] EMERGENCY DEPARTMENT Provider Note   CSN: [id number] Arrival date & time: 08/26/17  4098     History   Chief Complaint Chief Complaint  Patient presents with  . Hip Pain  . Back Pain    HPI Kristina Edwards is a 57 y.o. female.  HPI   Kristina Edwards is a 57 y.o. female who presents to the Emergency Department complaining of low back and right leg pain.  She states the pain began yesterday.  She describes the pain as sharp and stabbing radiating from her lower back down to the level of her right knee.  Pain is worse with movement.  Improves at rest.  She states she has a history of sciatica and her symptoms today feel similar to previous.  She is tried naproxen without relief.  She also complains of "chest pressure" that began this morning.  No diaphoresis, nausea or vomiting or shortness of breath.  She feels that this is related to her level of pain from her back.  She denies known injury, urine or bowel changes, abdominal pain, numbness or weakness of the lower extremities.  No hx of ACS.      Past Medical History:  Diagnosis Date  . Chronic back pain   . Lumbar radiculopathy   . Lung nodules    LLL incidental finding CT 10/14. repeat 6 months  . Sciatica     Patient Active Problem List   Diagnosis Date Noted  . UTI (lower urinary tract infection) 03/14/2013  . Abdominal pain 03/14/2013  . Leukocytosis 03/14/2013  . Lung nodules 03/14/2013    Past Surgical History:  Procedure Laterality Date  . CESAREAN SECTION    . PLEURAL SCARIFICATION       OB History    Gravida  3   Para  3   Term  3   Preterm      AB      Living        SAB      TAB      Ectopic      Multiple      Live Births               Home Medications    Prior to Admission medications   Medication Sig Start Date End Date Taking? Authorizing Provider  cyclobenzaprine (FLEXERIL) 10 MG tablet Take 1 tablet (10 mg total) by mouth 3 (three) times daily. 05/14/16    Ivery Quale, PA-C  dexamethasone (DECADRON) 4 MG tablet Take 1 tablet (4 mg total) by mouth 2 (two) times daily with a meal. 05/14/16   Ivery Quale, PA-C  diclofenac (VOLTAREN) 75 MG EC tablet Take 1 tablet (75 mg total) by mouth 2 (two) times daily. 05/14/16   Ivery Quale, PA-C  HYDROcodone-acetaminophen (NORCO/VICODIN) 5-325 MG tablet 1 or 2 tabs PO q6 hours prn pain 08/28/15   Samuel Jester, DO  ibuprofen (ADVIL,MOTRIN) 600 MG tablet Take 1 tablet (600 mg total) by mouth every 6 (six) hours as needed. 10/28/16   Eber Hong, MD  methocarbamol (ROBAXIN) 500 MG tablet Take 2 tablets (1,000 mg total) by mouth 4 (four) times daily as needed for muscle spasms (muscle spasm/pain). 08/28/15   Samuel Jester, DO  naproxen (NAPROSYN) 250 MG tablet Take 1 tablet (250 mg total) by mouth 2 (two) times daily as needed for mild pain or moderate pain (take with food). 08/28/15   Samuel Jester, DO    Family History Family History  Problem  Relation Age of Onset  . Cancer - Lung Father     Social History Social History   Tobacco Use  . Smoking status: Current Every Day Smoker    Packs/day: 1.00    Types: Cigarettes  . Smokeless tobacco: Never Used  Substance Use Topics  . Alcohol use: Yes    Comment: occ  . Drug use: No      Allergies   Clindamycin/lincomycin; Codeine; and Penicillins   Review of Systems Review of Systems  Constitutional: Negative for fever.  Respiratory: Positive for chest tightness. Negative for shortness of breath.   Cardiovascular: Positive for chest pain.  Gastrointestinal: Negative for abdominal pain, nausea and vomiting.  Genitourinary: Negative for decreased urine volume, difficulty urinating, dysuria, flank pain and hematuria.  Musculoskeletal: Positive for back pain. Negative for joint swelling.  Skin: Negative for rash.  Neurological: Negative for weakness and numbness.  All other systems reviewed and are negative.    Physical Exam Updated  Vital Signs BP (!) 142/87 (BP Location: Left Arm)   Pulse 86   Temp 98.2 F (36.8 C) (Oral)   Resp 18   Ht 5\' 4"  (1.626 m)   Wt 52.2 kg (115 lb)   LMP 02/12/2013   SpO2 97%   BMI 19.74 kg/m   Physical Exam  Constitutional: She is oriented to person, place, and time. She appears well-developed and well-nourished. No distress.  HENT:  Head: Normocephalic and atraumatic.  Eyes: Pupils are equal, round, and reactive to light. EOM are normal.  Neck: Normal range of motion. Neck supple.  Cardiovascular: Normal rate, regular rhythm and intact distal pulses.  DP pulses are strong and palpable bilaterally  Pulmonary/Chest: Effort normal and breath sounds normal. No respiratory distress.  Abdominal: Soft. She exhibits no distension. There is no tenderness.  Musculoskeletal: She exhibits tenderness. She exhibits no edema.       Lumbar back: She exhibits tenderness and pain. She exhibits normal range of motion, no swelling, no deformity, no laceration and normal pulse.  ttp of the lower lumbar spine and right paraspinal muscles and right SI joint.  Pt has 5/5 strength against resistance of bilateral lower extremities.  No posterior calf pain, erythema or edema   Neurological: She is alert and oriented to person, place, and time. She has normal strength. No sensory deficit. She exhibits normal muscle tone. Coordination and gait normal.  Reflex Scores:      Patellar reflexes are 2+ on the right side and 2+ on the left side.      Achilles reflexes are 2+ on the right side and 2+ on the left side. Skin: Skin is warm and dry. Capillary refill takes less than 2 seconds. No rash noted.  Psychiatric: She has a normal mood and affect.  Nursing note and vitals reviewed.    ED Treatments / Results  Labs (all labs ordered are listed, but only abnormal results are displayed) Labs Reviewed  BASIC METABOLIC PANEL - Abnormal; Notable for the following components:      Result Value   Glucose, Bld 114  (*)    All other components within normal limits  CBC WITH DIFFERENTIAL/PLATELET - Abnormal; Notable for the following components:   WBC 11.6 (*)    Neutro Abs 9.5 (*)    All other components within normal limits  TROPONIN I  TROPONIN I    EKG EKG Interpretation  Date/Time:  Sunday August 26 2017 09:09:56 EDT Ventricular Rate:  83 PR Interval:    QRS  Duration: 89 QT Interval:  393 QTC Calculation: 462 R Axis:   59 Text Interpretation:  Sinus rhythm Consider left ventricular hypertrophy Baseline wander When compared with ECG of 10/27/2016 No significant change was found Confirmed by Samuel JesterMcManus, Kathleen [phone]([id number]) on 08/26/2017 9:17:41 AM   Radiology Dg Chest 1 View  Result Date: 08/26/2017 CLINICAL DATA:  Acute chest pain for 1 day. EXAM: CHEST  1 VIEW COMPARISON:  10/27/2016 and prior radiograph FINDINGS: The cardiomediastinal silhouette is unremarkable. There is no evidence of focal airspace disease, pulmonary edema, suspicious pulmonary nodule/mass, pleural effusion, or pneumothorax. No acute bony abnormalities are identified. IMPRESSION: No active disease. Electronically Signed   By: Harmon PierJeffrey  Hu M.D.   On: 08/26/2017 10:09   Dg Lumbar Spine Complete  Result Date: 08/26/2017 CLINICAL DATA:  Acute low back pain for 1 day.  Initial encounter. EXAM: LUMBAR SPINE - COMPLETE 4+ VIEW COMPARISON:  05/14/2016 radiographs and prior exams. FINDINGS: There is no evidence of lumbar spine fracture. Alignment is normal. Intervertebral disc spaces are maintained. IMPRESSION: Negative. Electronically Signed   By: Harmon PierJeffrey  Hu M.D.   On: 08/26/2017 10:08    Procedures Procedures (including critical care time)  Medications Ordered in ED Medications  ketorolac (TORADOL) injection 60 mg (60 mg Intramuscular Given 08/26/17 0903)  traMADol (ULTRAM) tablet 50 mg (50 mg Oral Given 08/26/17 0903)     Initial Impression / Assessment and Plan / ED Course  I have reviewed the triage vital signs and the nursing  notes.  Pertinent labs & imaging results that were available during my care of the patient were reviewed by me and considered in my medical decision making (see chart for details).     Pt with chest pain and low back pain.  Work up reassuring.  Doubt ACS.  Low back pain likely related to sciatica.    She is feeling better after medication.  No focal neuro deficits. No concerning sx's for cauda equina or spinal abscess. Appears safe for d/c home, agrees to close out pt f/u.  Return precautions discussed   Final Clinical Impressions(s) / ED Diagnoses   Final diagnoses:  Precordial pain  Sciatica of right side    ED Discharge Orders    None       Pauline Ausriplett, Konstance Happel, PA-C 08/27/17 2118    Samuel JesterMcManus, Kathleen, DO 08/28/17 1304

## 2017-08-26 NOTE — ED Notes (Signed)
Pt transported to xray 

## 2017-08-26 NOTE — ED Triage Notes (Signed)
Pt c/o RT hip pain that radiates down to knee that began yesterday. Hx of sciatica. Denies injury or fall. Pt also c/o pleurisy especially when coughing. Cough, productive with yellow sputum. Recently with URI.

## 2019-09-08 ENCOUNTER — Emergency Department (HOSPITAL_COMMUNITY)

## 2019-09-08 ENCOUNTER — Encounter (HOSPITAL_COMMUNITY): Payer: Self-pay | Admitting: *Deleted

## 2019-09-08 ENCOUNTER — Other Ambulatory Visit: Payer: Self-pay

## 2019-09-08 ENCOUNTER — Emergency Department (HOSPITAL_COMMUNITY)
Admission: EM | Admit: 2019-09-08 | Discharge: 2019-09-08 | Disposition: A | Discharge status: Home / Self Care | Attending: Emergency Medicine | Admitting: Emergency Medicine

## 2019-09-08 DIAGNOSIS — M2392 Unspecified internal derangement of left knee: Secondary | ICD-10-CM | POA: Insufficient documentation

## 2019-09-08 DIAGNOSIS — M25562 Pain in left knee: Secondary | ICD-10-CM | POA: Diagnosis present

## 2019-09-08 DIAGNOSIS — Y9389 Activity, other specified: Secondary | ICD-10-CM | POA: Insufficient documentation

## 2019-09-08 DIAGNOSIS — W1839XA Other fall on same level, initial encounter: Secondary | ICD-10-CM | POA: Insufficient documentation

## 2019-09-08 DIAGNOSIS — Y929 Unspecified place or not applicable: Secondary | ICD-10-CM | POA: Diagnosis not present

## 2019-09-08 DIAGNOSIS — F17210 Nicotine dependence, cigarettes, uncomplicated: Secondary | ICD-10-CM | POA: Insufficient documentation

## 2019-09-08 DIAGNOSIS — M79662 Pain in left lower leg: Secondary | ICD-10-CM | POA: Diagnosis not present

## 2019-09-08 DIAGNOSIS — Y999 Unspecified external cause status: Secondary | ICD-10-CM | POA: Insufficient documentation

## 2019-09-08 NOTE — ED Notes (Signed)
ED Provider at bedside. 

## 2019-09-08 NOTE — ED Provider Notes (Signed)
[facility] EMERGENCY DEPARTMENT Provider Note   CSN: [id number] Arrival date & time: 09/08/19  1254     History Chief Complaint  Patient presents with  . Knee Injury    Kristina Edwards is a 59 y.o. female.  HPI Patient presents after left lower extremity injury.  States she was trying to help her nephew on a boat and he slipped and pulled her down onto the rocks.  Complaining pain of left knee and left lower leg.  States she had difficulty walking on.  States she was try to use crutches and the pain was unbearable.  No pain in the hip.  No other injury.  Not on anticoagulation.  States there is swelling in the knee and she was told it could be some ligament damage.    Past Medical History:  Diagnosis Date  . Chronic back pain   . Lumbar radiculopathy   . Lung nodules    LLL incidental finding CT 10/14. repeat 6 months  . Sciatica     Patient Active Problem List   Diagnosis Date Noted  . UTI (lower urinary tract infection) 03/14/2013  . Abdominal pain 03/14/2013  . Leukocytosis 03/14/2013  . Lung nodules 03/14/2013    Past Surgical History:  Procedure Laterality Date  . CESAREAN SECTION    . PLEURAL SCARIFICATION       OB History    Gravida  3   Para  3   Term  3   Preterm      AB      Living        SAB      TAB      Ectopic      Multiple      Live Births              Family History  Problem Relation Age of Onset  . Cancer - Lung Father     Social History   Tobacco Use  . Smoking status: Current Every Day Smoker    Packs/day: 1.00    Types: Cigarettes  . Smokeless tobacco: Never Used  Substance Use Topics  . Alcohol use: Yes    Comment: occ  . Drug use: No    Home Medications Prior to Admission medications   Medication Sig Start Date End Date Taking? Authorizing Provider  cyclobenzaprine (FLEXERIL) 10 MG tablet Take 1 tablet (10 mg total) by mouth 3 (three) times daily as needed. 08/26/17   Triplett, Babette Relic, PA-C    HYDROcodone-acetaminophen (NORCO/VICODIN) 5-325 MG tablet 1 or 2 tabs PO q6 hours prn pain 08/28/15   Samuel Jester, DO  predniSONE (DELTASONE) 10 MG tablet Take 6 tablets day one, 5 tablets day two, 4 tablets day three, 3 tablets day four, 2 tablets day five, then 1 tablet day six 08/26/17   Triplett, Tammy, PA-C  traMADol (ULTRAM) 50 MG tablet Take 1 tablet (50 mg total) by mouth every 6 (six) hours as needed. 08/26/17   Triplett, Tammy, PA-C    Allergies    Clindamycin/lincomycin, Codeine, and Penicillins  Review of Systems   Review of Systems  Constitutional: Negative for appetite change.  Respiratory: Negative for shortness of breath.   Cardiovascular: Negative for chest pain.  Gastrointestinal: Negative for abdominal pain.  Musculoskeletal:       Left knee and lower leg pain.  Skin: Negative for wound.  Neurological: Negative for weakness and numbness.    Physical Exam Updated Vital Signs BP (!) 144/79 (BP Location: Right  Arm)   Pulse 79   Temp 98.6 F (37 C) (Oral)   Resp 16   Ht 5' 4.5" (1.638 m)   Wt 59 kg   LMP 02/12/2013   SpO2 99%   BMI 21.97 kg/m   Physical Exam Vitals and nursing note reviewed.  HENT:     Head: Atraumatic.  Chest:     Chest wall: No tenderness.  Abdominal:     Tenderness: There is no abdominal tenderness.  Musculoskeletal:        General: Tenderness present.     Cervical back: Neck supple.     Comments: Tenderness to left knee with effusion.  Particular tender lateral knee.  Pain with laterally valgus strain on the knee.  Does have tenderness proceeding about halfway down the fibula.  No tenderness over ankle.  Neurovascular intact in left foot.  Skin intact.  Skin:    Capillary Refill: Capillary refill takes less than 2 seconds.  Neurological:     Mental Status: She is alert.     ED Results / Procedures / Treatments   Labs (all labs ordered are listed, but only abnormal results are displayed) Labs Reviewed - No data to  display  EKG None  Radiology DG Tibia/Fibula Left  Result Date: 09/08/2019 CLINICAL DATA:  Lateral left knee pain after fall EXAM: LEFT KNEE - COMPLETE 4+ VIEW; LEFT TIBIA AND FIBULA - 2 VIEW COMPARISON:  None. FINDINGS: No acute fracture is seen. Minimal medial compartment joint space narrowing. Fabella noted. Moderate knee joint effusion. Soft tissue swelling and edema is seen about the knee. Dedicated views of the tibia and fibula demonstrate no acute fracture or malalignment. Limited view of the ankle appears unremarkable. IMPRESSION: 1. No acute fracture or malalignment of the left knee, tibia or fibula. 2. Moderate knee joint effusion. Soft tissue swelling and edema about the knee. Electronically Signed   By: Davina Poke D.O.   On: 09/08/2019 13:49   DG Knee Complete 4 Views Left  Result Date: 09/08/2019 CLINICAL DATA:  Lateral left knee pain after fall EXAM: LEFT KNEE - COMPLETE 4+ VIEW; LEFT TIBIA AND FIBULA - 2 VIEW COMPARISON:  None. FINDINGS: No acute fracture is seen. Minimal medial compartment joint space narrowing. Fabella noted. Moderate knee joint effusion. Soft tissue swelling and edema is seen about the knee. Dedicated views of the tibia and fibula demonstrate no acute fracture or malalignment. Limited view of the ankle appears unremarkable. IMPRESSION: 1. No acute fracture or malalignment of the left knee, tibia or fibula. 2. Moderate knee joint effusion. Soft tissue swelling and edema about the knee. Electronically Signed   By: Davina Poke D.O.   On: 09/08/2019 13:49    Procedures Procedures (including critical care time)  Medications Ordered in ED Medications - No data to display  ED Course  I have reviewed the triage vital signs and the nursing notes.  Pertinent labs & imaging results that were available during my care of the patient were reviewed by me and considered in my medical decision making (see chart for details).    MDM Rules/Calculators/A&P                       Patient with fall.  X-ray reassuring but has joint effusion.  Has likely internal derangement of knee.  Tibial plateau fracture considered but felt less likely.  Either way will immobilize with minimal weightbearing.  Follow-up with orthopedic surgery.  Patient requests no narcotic pain medicine. Final  Clinical Impression(s) / ED Diagnoses Final diagnoses:  Internal derangement of left knee    Rx / DC Orders ED Discharge Orders    None       Benjiman Core, MD 09/08/19 1409

## 2019-09-08 NOTE — ED Triage Notes (Signed)
Pt with left knee injury after a fall trying to help nephew on a boat and he slipped.  Pt states she heard a pop to left knee, swelling noted per pt.

## 2019-09-08 NOTE — ED Notes (Signed)
Pt to xray

## 2019-09-08 NOTE — ED Notes (Signed)
Pt returned from xray

## 2019-09-11 ENCOUNTER — Ambulatory Visit: Admitting: Orthopaedic Surgery

## 2019-09-11 ENCOUNTER — Encounter: Payer: Self-pay | Admitting: Orthopaedic Surgery

## 2021-01-28 IMAGING — DX DG TIBIA/FIBULA 2V*L*
2 series · 2 of 2 positions shown · non-contrast
Comparison: None.

CLINICAL DATA: Lateral left knee pain after fall

EXAM:
LEFT KNEE - COMPLETE 4+ VIEW; LEFT TIBIA AND FIBULA - 2 VIEW

[tibia ap]
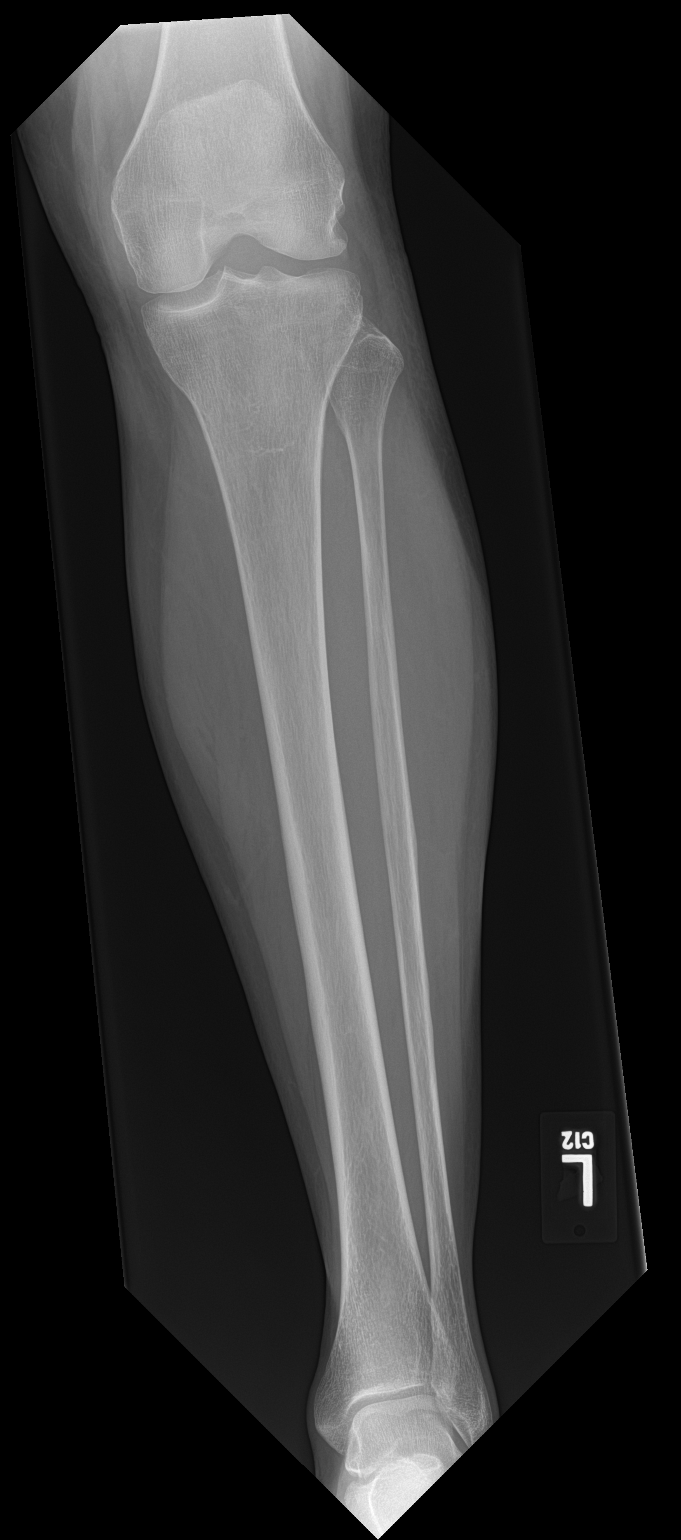

[tibia lat]
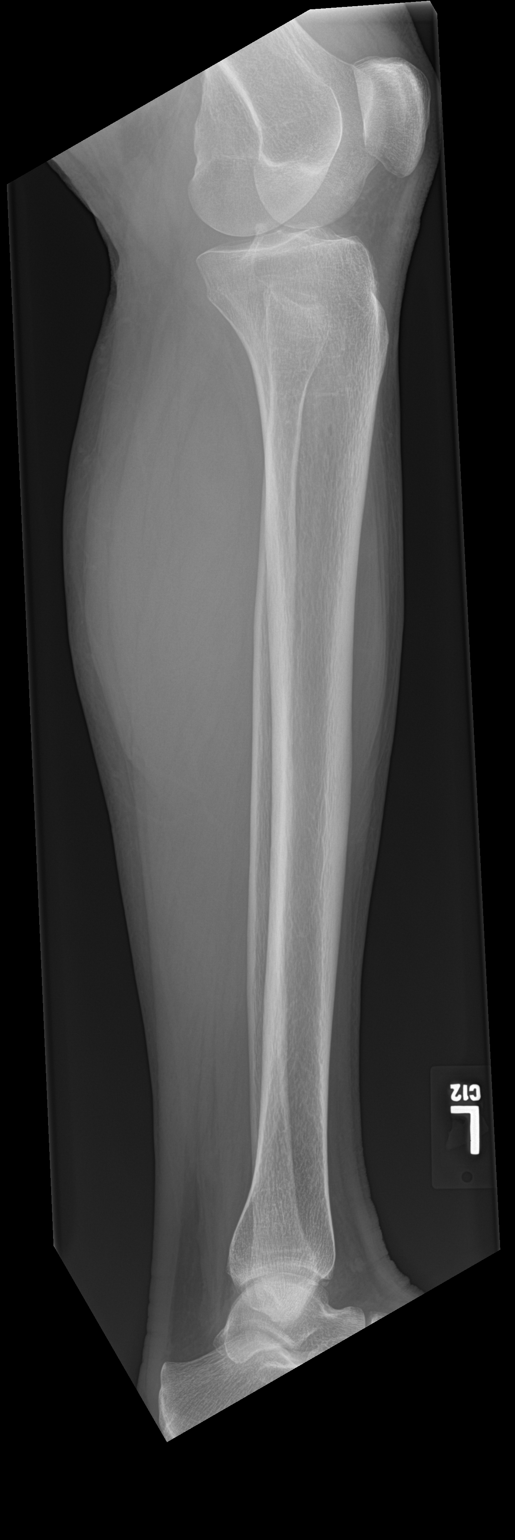

[2 of 2 positions shown; findings below may reference images not displayed]

FINDINGS: No acute fracture is seen. Minimal medial compartment joint space
narrowing. Fabella noted. Moderate knee joint effusion. Soft tissue
swelling and edema is seen about the knee. Dedicated views of the
tibia and fibula demonstrate no acute fracture or malalignment.
Limited view of the ankle appears unremarkable.
IMPRESSION: 1. No acute fracture or malalignment of the left knee, tibia or
fibula.
2. Moderate knee joint effusion. Soft tissue swelling and edema
about the knee.

## 2021-05-12 ENCOUNTER — Encounter: Payer: Self-pay | Admitting: *Deleted

## 2021-06-15 ENCOUNTER — Other Ambulatory Visit (HOSPITAL_COMMUNITY): Payer: Self-pay | Admitting: Family Medicine

## 2021-06-15 ENCOUNTER — Other Ambulatory Visit: Payer: Self-pay | Admitting: Family Medicine

## 2021-06-15 DIAGNOSIS — M79604 Pain in right leg: Secondary | ICD-10-CM

## 2021-06-17 ENCOUNTER — Encounter (HOSPITAL_COMMUNITY): Payer: Self-pay

## 2021-06-17 ENCOUNTER — Ambulatory Visit (HOSPITAL_COMMUNITY): Admission: RE | Admit: 2021-06-17 | Source: Ambulatory Visit

## 2021-06-24 ENCOUNTER — Other Ambulatory Visit: Payer: Self-pay

## 2021-06-24 ENCOUNTER — Ambulatory Visit (HOSPITAL_COMMUNITY)
Admission: RE | Admit: 2021-06-24 | Discharge: 2021-06-24 | Disposition: A | Discharge status: Home / Self Care | Source: Ambulatory Visit | Attending: Family Medicine | Admitting: Family Medicine

## 2021-06-24 DIAGNOSIS — M79604 Pain in right leg: Secondary | ICD-10-CM | POA: Insufficient documentation

## 2021-06-29 ENCOUNTER — Other Ambulatory Visit: Payer: Self-pay

## 2021-06-29 ENCOUNTER — Telehealth: Payer: Self-pay | Admitting: *Deleted

## 2021-06-29 ENCOUNTER — Encounter: Payer: Self-pay | Admitting: *Deleted

## 2021-06-29 ENCOUNTER — Ambulatory Visit: Payer: Self-pay

## 2021-06-29 NOTE — Telephone Encounter (Signed)
Tried to call pt for 4:00 nurse visit by telephone.  Had to [facility].

## 2022-06-05 ENCOUNTER — Emergency Department (HOSPITAL_COMMUNITY)
Admission: EM | Admit: 2022-06-05 | Discharge: 2022-06-06 | Disposition: A | Discharge status: Home / Self Care | Attending: Emergency Medicine | Admitting: Emergency Medicine

## 2022-06-05 ENCOUNTER — Encounter (HOSPITAL_COMMUNITY): Payer: Self-pay | Admitting: Emergency Medicine

## 2022-06-05 ENCOUNTER — Other Ambulatory Visit: Payer: Self-pay

## 2022-06-05 ENCOUNTER — Emergency Department (HOSPITAL_COMMUNITY)

## 2022-06-05 DIAGNOSIS — F17210 Nicotine dependence, cigarettes, uncomplicated: Secondary | ICD-10-CM | POA: Insufficient documentation

## 2022-06-05 DIAGNOSIS — R202 Paresthesia of skin: Secondary | ICD-10-CM | POA: Insufficient documentation

## 2022-06-05 DIAGNOSIS — Z720 Tobacco use: Secondary | ICD-10-CM

## 2022-06-05 DIAGNOSIS — R0789 Other chest pain: Secondary | ICD-10-CM | POA: Diagnosis not present

## 2022-06-05 DIAGNOSIS — R002 Palpitations: Secondary | ICD-10-CM | POA: Diagnosis not present

## 2022-06-05 LAB — BASIC METABOLIC PANEL
Anion gap: 8 (ref 5–15)
BUN: 19 mg/dL (ref 6–20)
CO2: 27 mmol/L (ref 22–32)
Calcium: 9.3 mg/dL (ref 8.9–10.3)
Chloride: 103 mmol/L (ref 98–111)
Creatinine, Ser: 1.04 mg/dL — ABNORMAL HIGH (ref 0.44–1.00)
GFR, Estimated: >60 mL/min (ref >60)
Glucose, Bld: 94 mg/dL (ref 70–99)
Potassium: 3.5 mmol/L (ref 3.5–5.1)
Sodium: 138 mmol/L (ref 135–145)

## 2022-06-05 LAB — CBC
HCT: 40.7 % (ref 36.0–46.0)
Hemoglobin: 13.4 g/dL (ref 12.0–15.0)
MCH: 31.2 pg (ref 26.0–34.0)
MCHC: 32.9 g/dL (ref 30.0–36.0)
MCV: 94.9 fL (ref 80.0–100.0)
Platelets: 302 10*3/uL (ref 150–400)
RBC: 4.29 MIL/uL (ref 3.87–5.11)
RDW: 12.5 % (ref 11.5–15.5)
WBC: 9.7 10*3/uL (ref 4.0–10.5)
nRBC: 0 % (ref 0.0–0.2)

## 2022-06-05 LAB — TROPONIN I (HIGH SENSITIVITY)
Troponin I (High Sensitivity): 2 ng/L (ref <18)
Troponin I (High Sensitivity): 3 ng/L (ref <18)

## 2022-06-05 NOTE — ED Triage Notes (Signed)
Pt states while working tonight her heart started racing and she became nauseous. Pt states her left arm kinda fell tingling and numb.

## 2022-06-05 NOTE — ED Provider Notes (Signed)
[facility] EMERGENCY DEPARTMENT Provider Note   CSN: [id number] Arrival date & time: 06/05/22  2053     History  Chief complaint: Palpitations  Kristina Edwards is a 62 y.o. female.  The history is provided by the patient.  She has no significant past history and comes in following an episode of her heart racing.  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  She tried to eat something and then noted that her heart was racing.  She developed some numbness in her left arm.  There was also some tightness in her lower sternal area.  She denies dyspnea, vomiting, diaphoresis.  Palpitations lasted for about 1 hour before resolving.  She still has some residual tight feeling in her lower sternal area.  She is a cigarette smoker, smokes 1 pack of cigarettes a day.  She denies history of diabetes, hypertension, hyperlipidemia and there is no family history of premature coronary atherosclerosis.   Home Medications Prior to Admission medications   Medication Sig Start Date End Date Taking? Authorizing Provider  cyclobenzaprine (FLEXERIL) 10 MG tablet Take 1 tablet (10 mg total) by mouth 3 (three) times daily as needed. 08/26/17   Triplett, Lynelle Smoke, PA-C  HYDROcodone-acetaminophen (NORCO/VICODIN) 5-325 MG tablet 1 or 2 tabs PO q6 hours prn pain 08/28/15   Francine Graven, DO  predniSONE (DELTASONE) 10 MG tablet Take 6 tablets day one, 5 tablets day two, 4 tablets day three, 3 tablets day four, 2 tablets day five, then 1 tablet day six 08/26/17   Triplett, Tammy, PA-C  traMADol (ULTRAM) 50 MG tablet Take 1 tablet (50 mg total) by mouth every 6 (six) hours as needed. 08/26/17   Triplett, Tammy, PA-C      Allergies    Clindamycin/lincomycin, Codeine, and Penicillins    Review of Systems   Review of Systems  All other systems reviewed and are negative.   Physical Exam Updated Vital Signs BP (!) 177/98   Pulse (!) 101   Temp 97.8 F (36.6 C) (Oral)   Resp 18   Ht 5\' 4"  (1.626 m)    Wt 56.7 kg   LMP 02/12/2013   SpO2 100%   BMI 21.46 kg/m  Physical Exam Vitals and nursing note reviewed.   62 year old female, resting comfortably and in no acute distress. Vital signs are significant for elevated blood pressure and borderline elevated heart rate. Oxygen saturation is 100%, which is normal. Head is normocephalic and atraumatic. PERRLA, EOMI. Oropharynx is clear. Neck is nontender and supple without adenopathy or JVD. Back is nontender and there is no CVA tenderness. Lungs are clear without rales, wheezes, or rhonchi. Chest is nontender. Heart has regular rate and rhythm without murmur. Abdomen is soft, flat, nontender. Extremities have no cyanosis or edema, full range of motion is present. Skin is warm and dry without rash. Neurologic: Mental status is normal, cranial nerves are intact, moves all extremities equally.  ED Results / Procedures / Treatments   Labs (all labs ordered are listed, but only abnormal results are displayed) Labs Reviewed  BASIC METABOLIC PANEL - Abnormal; Notable for the following components:      Result Value   Creatinine, Ser 1.04 (*)    All other components within normal limits  CBC  POC URINE PREG, ED  TROPONIN I (HIGH SENSITIVITY)  TROPONIN I (HIGH SENSITIVITY)    EKG EKG Interpretation  Date/Time:  Monday June 05 2022 21:01:08 EST Ventricular Rate:  101 PR Interval:  154 QRS Duration: 74 QT Interval:  370 QTC Calculation: 479 R Axis:   56 Text Interpretation: Sinus tachycardia Otherwise normal ECG When compared with ECG of 26-Aug-2017 09:09, No significant change was found Confirmed by Dione Booze ([id number]) on 06/05/2022 11:29:27 PM  Radiology DG Chest 2 View  Result Date: 06/05/2022 CLINICAL DATA:  Chest pain EXAM: CHEST - 2 VIEW COMPARISON:  None Available. FINDINGS: The lungs are mildly symmetrically hyperinflated and there are changes of biapical emphysema noted. No superimposed confluent pulmonary infiltrate. No  pneumothorax or pleural effusion. Cardiac size within normal limits. Pulmonary vascularity is normal. No acute bone abnormality. IMPRESSION: 1. No active cardiopulmonary disease. 2. Mild emphysema. Electronically Signed   By: Helyn Numbers M.D.   On: 06/05/2022 21:21    Procedures Procedures  Cardiac monitor shows normal sinus rhythm, per my interpretation.  Medications Ordered in ED Medications - No data to display  ED Course/ Medical Decision Making/ A&P                           Medical Decision Making Amount and/or Complexity of Data Reviewed Labs: ordered. Radiology: ordered.   Episode of palpitations with associated chest discomfort.  I suspect this was PSVT, consider paroxysmal atrial fibrillation.  Doubt ventricular tachycardia.  I have reviewed and interpreted her electrocardiogram and my interpretation is normal ECG.  Chest x-ray shows no active cardiopulmonary disease but mild emphysema.  I have independently viewed the images, and agree with radiologist interpretation.  I have reviewed and interpreted her laboratory tests and my interpretation is borderline elevated creatinine, and normal troponin with repeat troponin pending.  Otherwise normal basic metabolic panel and normal CBC. .  Repeat troponin is normal, I feel she would benefit from outpatient cardiac monitoring.  I have ordered ambulatory referral to cardiology.  I have counseled her on the need to stop smoking and supplying resources to help her quit smoking.  Final Clinical Impression(s) / ED Diagnoses Final diagnoses:  Palpitations  Chest discomfort  Paresthesias  Tobacco abuse    Rx / DC Orders ED Discharge Orders          Ordered    Ambulatory referral to Cardiology       Comments: If you have not heard from the Cardiology office within the next 72 hours please call [phone].   06/05/22 2346              Dione Booze, MD 06/05/22 2347

## 2022-06-22 ENCOUNTER — Encounter: Payer: Self-pay | Admitting: *Deleted

## 2022-06-22 ENCOUNTER — Ambulatory Visit: Attending: Internal Medicine | Admitting: Internal Medicine

## 2022-06-22 ENCOUNTER — Telehealth: Payer: Self-pay | Admitting: Internal Medicine

## 2022-06-22 ENCOUNTER — Encounter: Payer: Self-pay | Admitting: Internal Medicine

## 2022-06-22 VITALS — BP 118/84 | HR 80 | Wt 132.2 lb

## 2022-06-22 DIAGNOSIS — Z720 Tobacco use: Secondary | ICD-10-CM | POA: Diagnosis not present

## 2022-06-22 DIAGNOSIS — R079 Chest pain, unspecified: Secondary | ICD-10-CM | POA: Diagnosis not present

## 2022-06-22 DIAGNOSIS — R002 Palpitations: Secondary | ICD-10-CM

## 2022-06-22 DIAGNOSIS — Z8279 Family history of other congenital malformations, deformations and chromosomal abnormalities: Secondary | ICD-10-CM

## 2022-06-22 NOTE — Patient Instructions (Addendum)
Medication Instructions:  Your physician recommends that you continue on your current medications as directed. Please refer to the Current Medication list given to you today.  Labwork: none  Testing/Procedures: Your physician has requested that you have an echocardiogram. Echocardiography is a painless test that uses sound waves to create images of your heart. It provides your doctor with information about the size and shape of your heart and how well your heart's chambers and valves are working. This procedure takes approximately one hour. There are no restrictions for this procedure. Please do NOT wear cologne, perfume, aftershave, or lotions (deodorant is allowed). Please arrive 15 minutes prior to your appointment time. Your physician has requested that you have en exercise stress myoview. For further information please visit [email or web address]. Please follow instruction sheet, as given.  Follow-Up: Your physician recommends that you schedule a follow-up appointment in: 6 months  Any Other Special Instructions Will Be Listed Below (If Applicable). Kardia Mobile-device to check heart rhythm Call office if you have recurrent palpitations to get set up for a heart monitor  If you need a refill on your cardiac medications before your next appointment, please call your pharmacy.

## 2022-06-22 NOTE — Telephone Encounter (Signed)
Checking percert on the following patient for testing scheduled at [facility].   EXERCISE STRESS - 06/28/2022

## 2022-06-22 NOTE — Progress Notes (Signed)
Cardiology Office Note  Date: 06/22/2022   ID: Kristina Edwards, DOB 1960/07/28, MRN [id number]  PCP:  Olga Coaster, FNP  Cardiologist:  None Electrophysiologist:  None   Reason for Office Visit: Evaluation of palpitations at the request of Dr. Roxanne Mins   History of Present Illness: Kristina Edwards is a 61 y.o. female with no PMH was referred to cardiology clinic for evaluation palpitations.  Patient works as a Educational psychologist, felt chest pressure radiating to her left arm associated with palpitations lasting for 1 hour along with dizziness that prompted ER visit on 06/05/2022. EKG showed sinus tachycardia. High-sensitivity troponins were within normal limits (3,2). Upon discharge from the ER, in a few days she had recurrent palpitations and chest pressure lasting for almost 1 hour but not as severe as it was on 06/05/2022. She did not have any recurrence since then. This was the first time she ever had the palpitations/chest pressure. Otherwise, she is physically active at baseline, can perform all her household chores with no symptoms. Denied any syncope, leg swelling. Smokes less than 1 pack/day and has no plan to quit smoking. Denies alcohol use and is a drug abuse.  Patient added that her husband had triple-vessel bypass and her son had bicuspid aortic valve with leakiness which will have to be repeated in the future per his cardiologist.  Past Medical History:  Diagnosis Date   Chronic back pain    Lumbar radiculopathy    Lung nodules    LLL incidental finding CT 10/14. repeat 6 months   Sciatica     Past Surgical History:  Procedure Laterality Date   CESAREAN SECTION     PLEURAL SCARIFICATION      Current Outpatient Medications  Medication Sig Dispense Refill   cyclobenzaprine (FLEXERIL) 10 MG tablet Take 1 tablet (10 mg total) by mouth 3 (three) times daily as needed. 21 tablet 0   No current facility-administered medications for this visit.   Allergies:   Clindamycin/lincomycin, Codeine, and Penicillins   Social History: The patient  reports that she has been smoking cigarettes. She has been smoking an average of 1 pack per day. She has never used smokeless tobacco. She reports current alcohol use. She reports that she does not use drugs.   Family History: The patient's family history includes Cancer - Lung in her father.   ROS:  Please see the history of present illness. Otherwise, complete review of systems is positive for none.  All other systems are reviewed and negative.   Physical Exam: VS:  Wt 132 lb 3.2 oz (60 kg)   LMP 02/12/2013   BMI 22.69 kg/m , BMI Body mass index is 22.69 kg/m.  Wt Readings from Last 3 Encounters:  06/22/22 132 lb 3.2 oz (60 kg)  06/05/22 125 lb (56.7 kg)  09/08/19 130 lb (59 kg)    General: Patient appears comfortable at rest. HEENT: Conjunctiva and lids normal, oropharynx clear with moist mucosa. Neck: Supple, no elevated JVP or carotid bruits, no thyromegaly. Lungs: Clear to auscultation, nonlabored breathing at rest. Cardiac: Regular rate and rhythm, no S3 or significant systolic murmur, no pericardial rub. Abdomen: Soft, nontender, no hepatomegaly, bowel sounds present, no guarding or rebound. Extremities: No pitting edema, distal pulses 2+. Skin: Warm and dry. Musculoskeletal: No kyphosis. Neuropsychiatric: Alert and oriented x3, affect grossly appropriate.  ECG:  An ECG dated 06/05/2022 was personally reviewed today and demonstrated:  Sinus tachycardia  Recent Labwork: 06/05/2022: BUN 19; Creatinine, Ser 1.04; Hemoglobin  13.4; Platelets 302; Potassium 3.5; Sodium 138  No results found for: "CHOL", "TRIG", "HDL", "CHOLHDL", "VLDL", "LDLCALC", "LDLDIRECT"  Other Studies Reviewed Today: I personally reviewed the ER notes from 06/05/2022  Assessment and Plan: Patient is a 62 year old F with no PMH was referred to cardiology clinic for evaluation of palpitations.  # Possibly cardiac chest  pain -Patient had two episodes of chest pressure radiating to the arm and lasted for 1 hour. Obtain treadmill exercise Myoview.  # History of bicuspid aortic valve in her son # Possibly cardiac chest pain -Obtain 2D echocardiogram  # Palpitations -Patient had 2 episodes of palpitations lasting for 1 hour in the second week of January 2024 and had no recurrence since then.  Instructed patient to obtain a Kardia mobile as she continues to have infrequent palpitations. However if palpitations become frequent in the future, she will need to call the clinic to obtain 2-week event monitor (non-live).  # Nicotine abuse (smokes less than 1 pack/day) -Smoking cessation counseling provided. Smoking cessation instruction/counseling given:  counseled patient on the dangers of tobacco use, advised patient to stop smoking, and reviewed strategies to maximize success   I have spent a total of 45 minutes with patient reviewing chart, EKGs, labs and examining patient as well as establishing an assessment and plan that was discussed with the patient.  > 50% of time was spent in direct patient care.     Medication Adjustments/Labs and Tests Ordered: Current medicines are reviewed at length with the patient today.  Concerns regarding medicines are outlined above.   Tests Ordered: No orders of the defined types were placed in this encounter.   Medication Changes: No orders of the defined types were placed in this encounter.   Disposition:  Follow up  6 months  Signed Lagretta Loseke Fidel Levy, MD, 06/22/2022 8:12 AM    [facility] [place], [place], [place] [address]

## 2022-06-28 ENCOUNTER — Inpatient Hospital Stay (HOSPITAL_COMMUNITY): Admission: RE | Admit: 2022-06-28 | Source: Ambulatory Visit

## 2022-06-28 ENCOUNTER — Encounter (HOSPITAL_COMMUNITY)

## 2022-07-11 ENCOUNTER — Ambulatory Visit: Attending: Internal Medicine

## 2022-07-11 DIAGNOSIS — R079 Chest pain, unspecified: Secondary | ICD-10-CM

## 2022-07-11 LAB — ECHOCARDIOGRAM COMPLETE
AR max vel: 1.87 cm2
AV Peak grad: 5.4 mmHg
Ao pk vel: 1.17 m/s
Area-P 1/2: 3.6 cm2
Calc EF: 62.4 %
P 1/2 time: 802 msec
S' Lateral: 2.7 cm
Single Plane A2C EF: 58.9 %
Single Plane A4C EF: 62 %

## 2022-07-21 ENCOUNTER — Encounter (HOSPITAL_COMMUNITY)

## 2022-07-21 ENCOUNTER — Telehealth: Payer: Self-pay | Admitting: Internal Medicine

## 2022-07-21 NOTE — Telephone Encounter (Signed)
Checking percert on the following patient for testing scheduled at [facility].    Stress test - rescheduled to 07/27/2021

## 2022-07-27 ENCOUNTER — Encounter: Payer: Self-pay | Admitting: Radiology

## 2022-07-28 ENCOUNTER — Telehealth: Payer: Self-pay | Admitting: Internal Medicine

## 2022-07-28 ENCOUNTER — Encounter (HOSPITAL_COMMUNITY)

## 2022-07-28 NOTE — Telephone Encounter (Signed)
I will forward to schedulers to r/s nuc stress test

## 2022-07-28 NOTE — Telephone Encounter (Signed)
Husband called to cancel patient's test today.  Husband stated patient was called in to work on an emergency and she will call back to reschedule.

## 2022-11-14 IMAGING — US US EXTREM LOW VENOUS*R*
1 series · 14 of 24 positions shown · non-contrast
Comparison: None.

CLINICAL DATA: RIGHT lower extremity pain.

EXAM:
RIGHT LOWER EXTREMITY VENOUS DOPPLER ULTRASOUND
TECHNIQUE: Gray-scale sonography with compression, as well as color and duplex
ultrasound, were performed to evaluate the deep venous system(s)
from the level of the common femoral vein through the popliteal and
proximal calf veins.

[Series 1: us venous img lower uni right (dvt) · portal-venous · 14 of 44 slices shown]
[im 1/44]
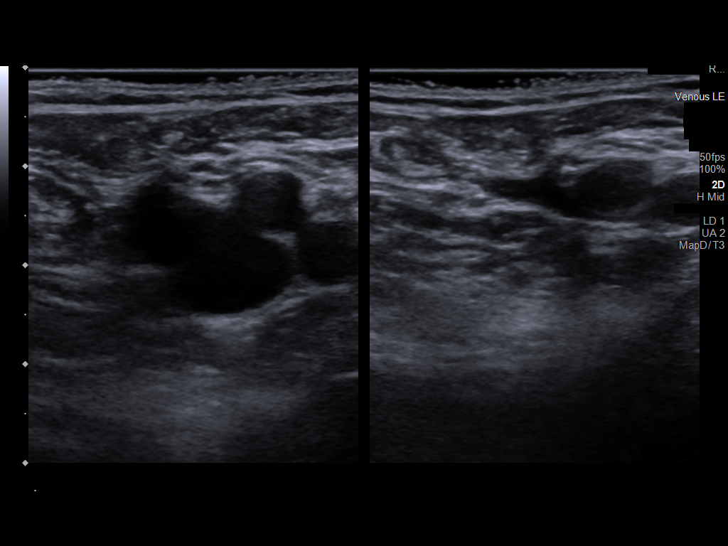
[im 4/44]
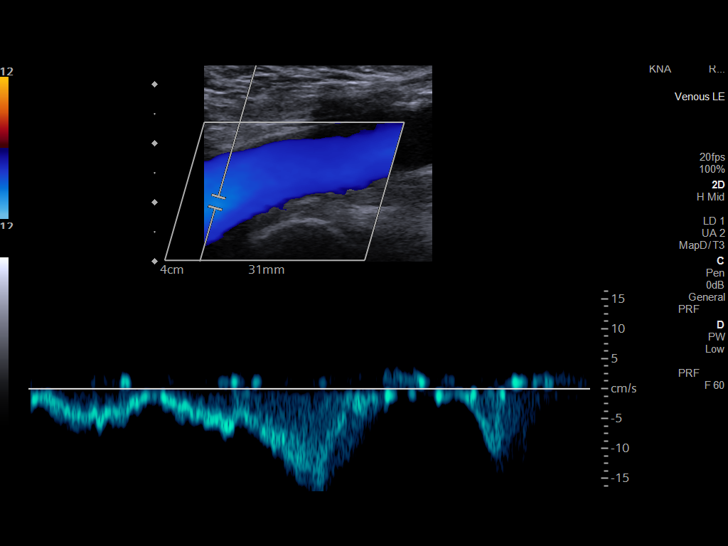
[im 8/44]
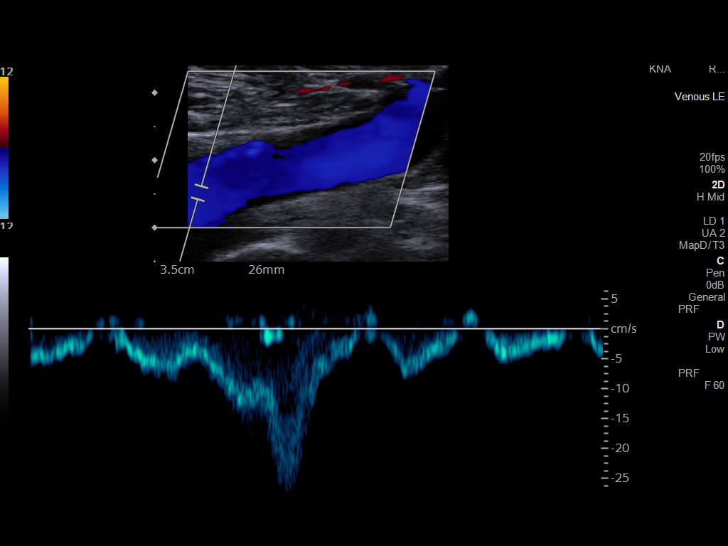
[im 12/44]
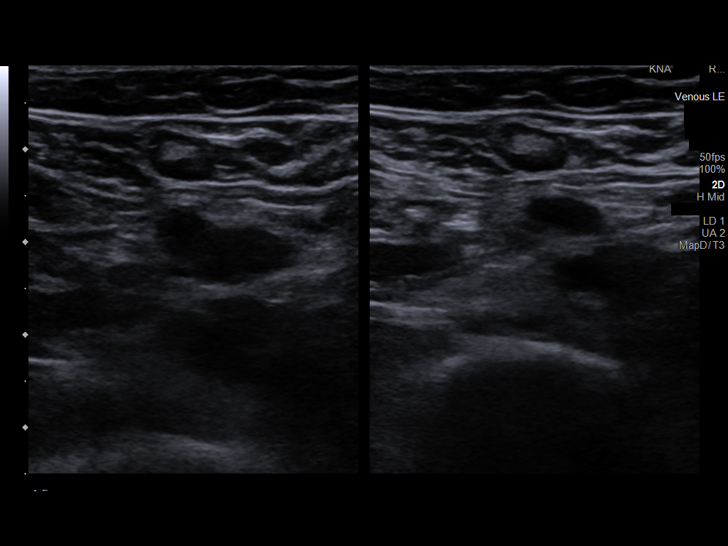
[im 14/44]
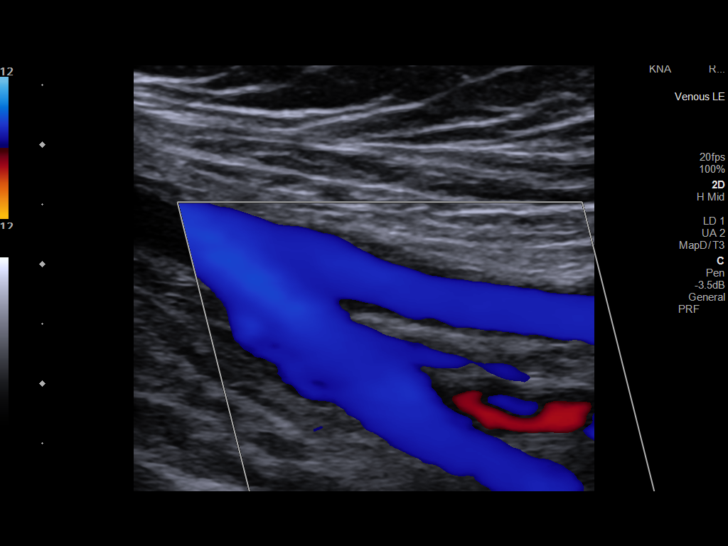
[im 17/44]
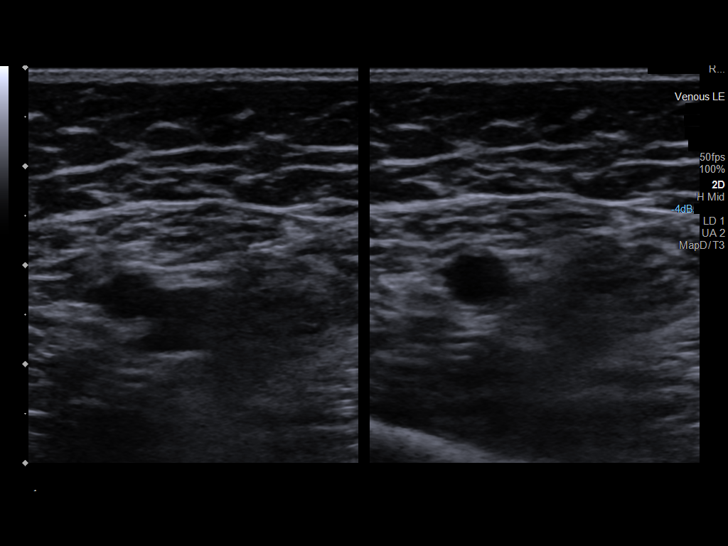
[im 21/44]
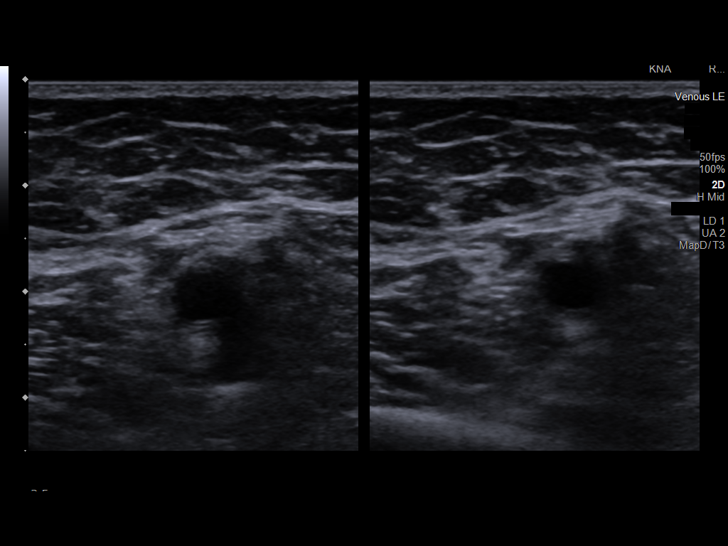
[im 23/44]
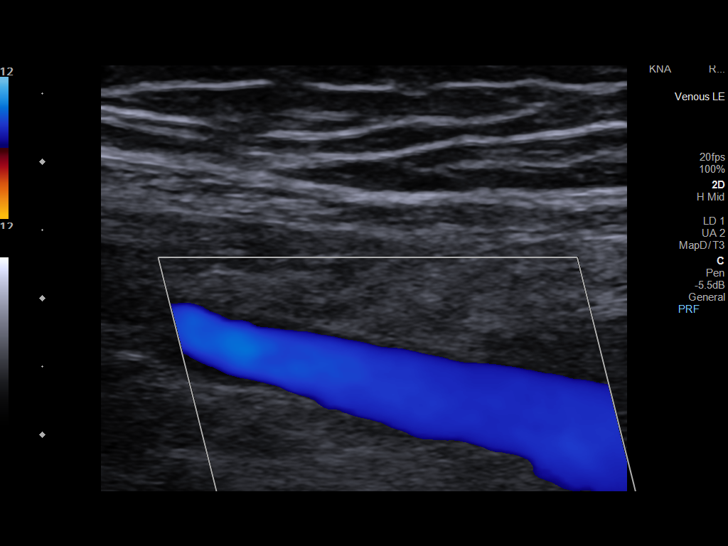
[im 27/44]
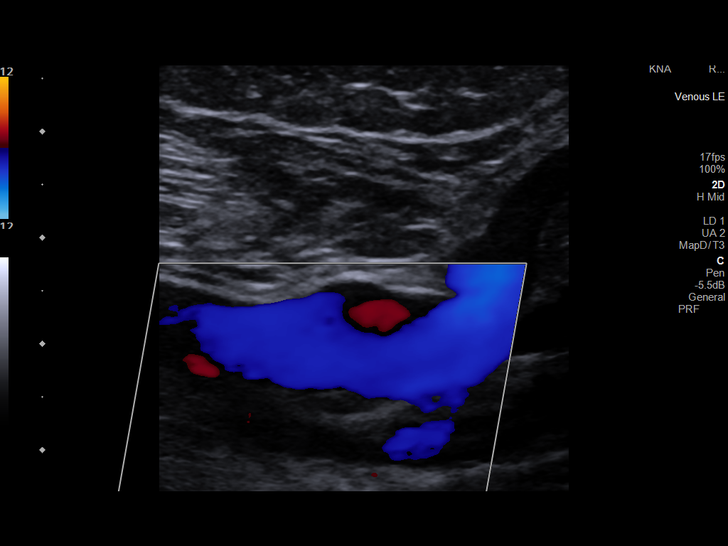
[im 30/44]
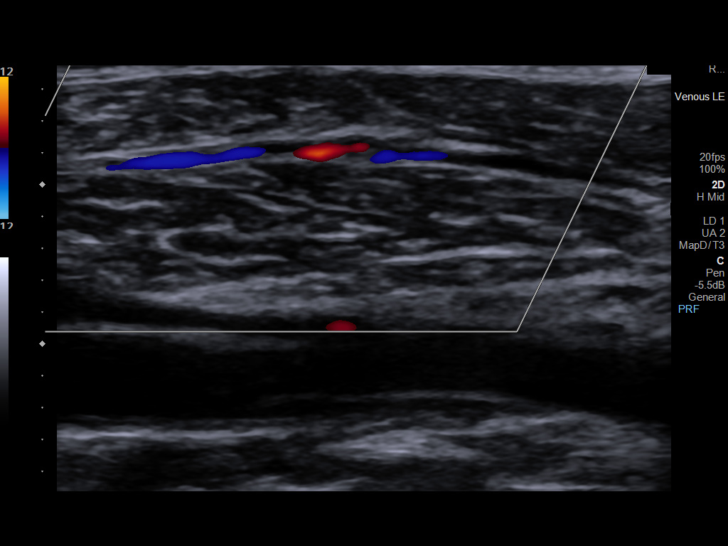
[im 34/44]
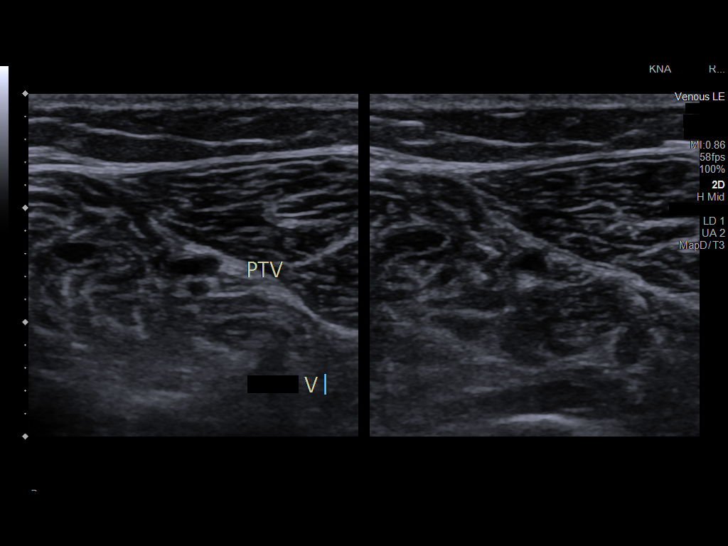
[im 36/44]
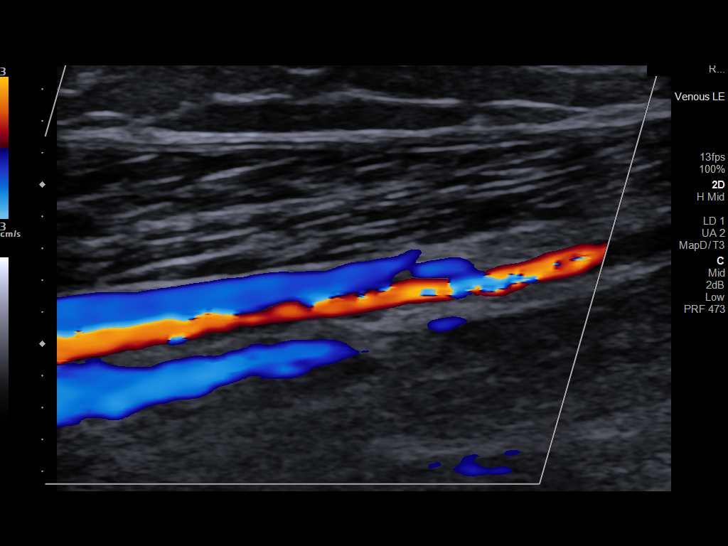
[im 40/44]
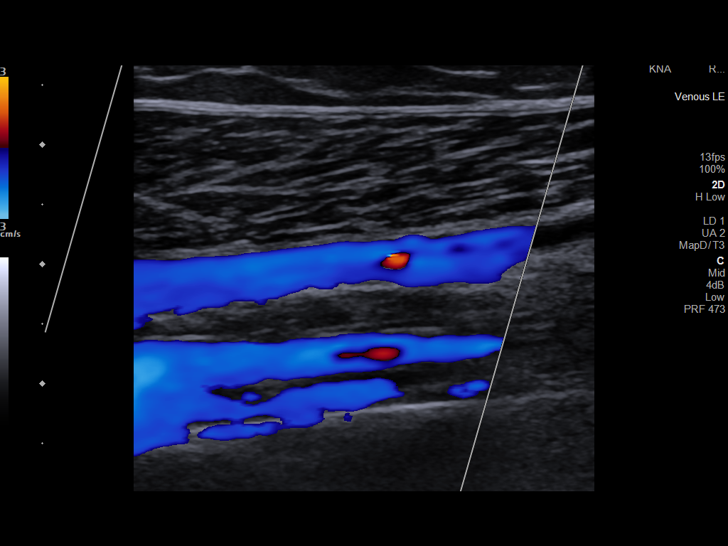
[im 44/44]
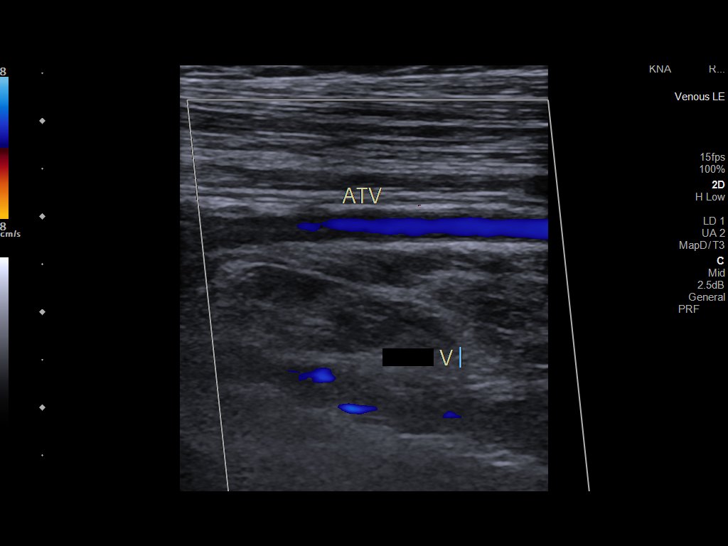

[14 of 24 positions shown; findings below may reference images not displayed]

FINDINGS: VENOUS

Normal compressibility of the RIGHT common femoral, superficial
femoral, and popliteal veins, as well as the visualized calf veins.
Visualized portions of profunda femoral vein and great saphenous
vein unremarkable. No filling defects to suggest DVT on grayscale or
color Doppler imaging. Doppler waveforms show normal direction of
venous flow, normal respiratory plasticity and response to
augmentation.

Limited views of the contralateral common femoral vein are
unremarkable.

OTHER

No evidence of superficial thrombophlebitis or abnormal fluid
collection.

Limitations: none
IMPRESSION: No evidence of femoropopliteal DVT within the RIGHT lower extremity.

## 2022-12-08 ENCOUNTER — Ambulatory Visit: Admitting: Internal Medicine

## 2022-12-08 NOTE — Progress Notes (Signed)
Erroneous encounter - please disregard.
